# Patient Record
Sex: Female | Born: 1940
Health system: Southern US, Community
[De-identification: ages and names within clinical notes are randomized; demographics above are authoritative.]

## PROBLEM LIST (undated history)

## (undated) DIAGNOSIS — S3210XA Unspecified fracture of sacrum, initial encounter for closed fracture: Secondary | ICD-10-CM

## (undated) DIAGNOSIS — G8929 Other chronic pain: Secondary | ICD-10-CM

## (undated) DIAGNOSIS — F32A Depression, unspecified: Secondary | ICD-10-CM

## (undated) DIAGNOSIS — M1990 Unspecified osteoarthritis, unspecified site: Secondary | ICD-10-CM

## (undated) DIAGNOSIS — Q791 Other congenital malformations of diaphragm: Secondary | ICD-10-CM

## (undated) DIAGNOSIS — K219 Gastro-esophageal reflux disease without esophagitis: Secondary | ICD-10-CM

## (undated) DIAGNOSIS — F419 Anxiety disorder, unspecified: Secondary | ICD-10-CM

## (undated) DIAGNOSIS — J189 Pneumonia, unspecified organism: Secondary | ICD-10-CM

## (undated) DIAGNOSIS — F329 Major depressive disorder, single episode, unspecified: Secondary | ICD-10-CM

## (undated) DIAGNOSIS — Z8719 Personal history of other diseases of the digestive system: Secondary | ICD-10-CM

## (undated) DIAGNOSIS — K5790 Diverticulosis of intestine, part unspecified, without perforation or abscess without bleeding: Secondary | ICD-10-CM

## (undated) DIAGNOSIS — R319 Hematuria, unspecified: Secondary | ICD-10-CM

## (undated) DIAGNOSIS — J449 Chronic obstructive pulmonary disease, unspecified: Secondary | ICD-10-CM

## (undated) DIAGNOSIS — E785 Hyperlipidemia, unspecified: Secondary | ICD-10-CM

## (undated) DIAGNOSIS — M549 Dorsalgia, unspecified: Secondary | ICD-10-CM

## (undated) HISTORY — DX: Dorsalgia, unspecified: M54.9

## (undated) HISTORY — DX: Hyperlipidemia, unspecified: E78.5

## (undated) HISTORY — DX: Unspecified fracture of sacrum, initial encounter for closed fracture: S32.10XA

## (undated) HISTORY — PX: BREAST REDUCTION SURGERY: SHX8

## (undated) HISTORY — PX: TONSILLECTOMY: SUR1361

## (undated) HISTORY — PX: ROTATOR CUFF REPAIR: SHX139

## (undated) HISTORY — PX: DILATION AND CURETTAGE OF UTERUS: SHX78

## (undated) HISTORY — PX: TOTAL ABDOMINAL HYSTERECTOMY: SHX209

## (undated) HISTORY — DX: Other chronic pain: G89.29

## (undated) HISTORY — PX: VASCULAR SURGERY: SHX849

## (undated) HISTORY — DX: Hematuria, unspecified: R31.9

## (undated) HISTORY — DX: Unspecified osteoarthritis, unspecified site: M19.90

## (undated) HISTORY — DX: Gastro-esophageal reflux disease without esophagitis: K21.9

## (undated) HISTORY — DX: Diverticulosis of intestine, part unspecified, without perforation or abscess without bleeding: K57.90

## (undated) HISTORY — DX: Other congenital malformations of diaphragm: Q79.1

---

## 1998-01-30 ENCOUNTER — Ambulatory Visit (HOSPITAL_COMMUNITY): Admission: RE | Admit: 1998-01-30 | Discharge: 1998-01-30 | Payer: Self-pay | Admitting: Internal Medicine

## 1998-01-30 ENCOUNTER — Encounter: Payer: Self-pay | Admitting: Internal Medicine

## 1998-03-11 ENCOUNTER — Other Ambulatory Visit: Admission: RE | Admit: 1998-03-11 | Discharge: 1998-03-11 | Payer: Self-pay | Admitting: Obstetrics & Gynecology

## 1998-06-27 ENCOUNTER — Other Ambulatory Visit: Admission: RE | Admit: 1998-06-27 | Discharge: 1998-06-27 | Payer: Self-pay | Admitting: Oral Surgery

## 1998-10-26 ENCOUNTER — Ambulatory Visit (HOSPITAL_COMMUNITY): Admission: RE | Admit: 1998-10-26 | Discharge: 1998-10-26 | Payer: Self-pay | Admitting: Gastroenterology

## 1998-10-26 ENCOUNTER — Encounter: Payer: Self-pay | Admitting: Gastroenterology

## 1998-11-12 ENCOUNTER — Ambulatory Visit (HOSPITAL_COMMUNITY): Admission: RE | Admit: 1998-11-12 | Discharge: 1998-11-12 | Payer: Self-pay | Admitting: Obstetrics and Gynecology

## 1998-11-12 ENCOUNTER — Encounter: Payer: Self-pay | Admitting: Obstetrics and Gynecology

## 1999-02-11 ENCOUNTER — Encounter: Payer: Self-pay | Admitting: Obstetrics and Gynecology

## 1999-02-11 ENCOUNTER — Ambulatory Visit (HOSPITAL_COMMUNITY): Admission: RE | Admit: 1999-02-11 | Discharge: 1999-02-11 | Payer: Self-pay | Admitting: Obstetrics and Gynecology

## 1999-04-02 ENCOUNTER — Encounter: Payer: Self-pay | Admitting: Obstetrics and Gynecology

## 1999-04-07 ENCOUNTER — Encounter (INDEPENDENT_AMBULATORY_CARE_PROVIDER_SITE_OTHER): Payer: Self-pay

## 1999-04-07 ENCOUNTER — Inpatient Hospital Stay (HOSPITAL_COMMUNITY): Admission: RE | Admit: 1999-04-07 | Discharge: 1999-04-08 | Payer: Self-pay | Admitting: Obstetrics and Gynecology

## 2000-02-10 ENCOUNTER — Ambulatory Visit (HOSPITAL_COMMUNITY): Admission: RE | Admit: 2000-02-10 | Discharge: 2000-02-10 | Payer: Self-pay | Admitting: Obstetrics and Gynecology

## 2000-02-10 ENCOUNTER — Encounter: Payer: Self-pay | Admitting: Obstetrics and Gynecology

## 2001-02-10 ENCOUNTER — Ambulatory Visit (HOSPITAL_COMMUNITY): Admission: RE | Admit: 2001-02-10 | Discharge: 2001-02-10 | Payer: Self-pay | Admitting: Obstetrics and Gynecology

## 2001-02-10 ENCOUNTER — Encounter: Payer: Self-pay | Admitting: Obstetrics and Gynecology

## 2001-07-12 ENCOUNTER — Other Ambulatory Visit: Admission: RE | Admit: 2001-07-12 | Discharge: 2001-07-12 | Payer: Self-pay | Admitting: Obstetrics and Gynecology

## 2002-02-21 ENCOUNTER — Ambulatory Visit (HOSPITAL_COMMUNITY): Admission: RE | Admit: 2002-02-21 | Discharge: 2002-02-21 | Payer: Self-pay | Admitting: Internal Medicine

## 2002-02-21 ENCOUNTER — Encounter: Payer: Self-pay | Admitting: Internal Medicine

## 2002-07-17 ENCOUNTER — Other Ambulatory Visit: Admission: RE | Admit: 2002-07-17 | Discharge: 2002-07-17 | Payer: Self-pay | Admitting: Obstetrics and Gynecology

## 2003-02-27 ENCOUNTER — Ambulatory Visit (HOSPITAL_COMMUNITY): Admission: RE | Admit: 2003-02-27 | Discharge: 2003-02-27 | Payer: Self-pay | Admitting: Internal Medicine

## 2003-05-22 ENCOUNTER — Encounter: Admission: RE | Admit: 2003-05-22 | Discharge: 2003-05-22 | Payer: Self-pay | Admitting: Internal Medicine

## 2004-03-07 ENCOUNTER — Ambulatory Visit (HOSPITAL_COMMUNITY): Admission: RE | Admit: 2004-03-07 | Discharge: 2004-03-07 | Payer: Self-pay | Admitting: Obstetrics and Gynecology

## 2005-03-12 ENCOUNTER — Ambulatory Visit (HOSPITAL_COMMUNITY): Admission: RE | Admit: 2005-03-12 | Discharge: 2005-03-12 | Payer: Self-pay | Admitting: Obstetrics and Gynecology

## 2005-05-04 ENCOUNTER — Encounter: Admission: RE | Admit: 2005-05-04 | Discharge: 2005-05-04 | Payer: Self-pay | Admitting: Internal Medicine

## 2005-10-29 ENCOUNTER — Ambulatory Visit (HOSPITAL_COMMUNITY): Admission: RE | Admit: 2005-10-29 | Discharge: 2005-10-29 | Payer: Self-pay | Admitting: Orthopaedic Surgery

## 2006-02-20 ENCOUNTER — Emergency Department (HOSPITAL_COMMUNITY): Admission: EM | Admit: 2006-02-20 | Discharge: 2006-02-20 | Payer: Self-pay | Admitting: Emergency Medicine

## 2006-03-16 ENCOUNTER — Ambulatory Visit (HOSPITAL_COMMUNITY): Admission: RE | Admit: 2006-03-16 | Discharge: 2006-03-16 | Payer: Self-pay | Admitting: Internal Medicine

## 2006-04-02 ENCOUNTER — Ambulatory Visit (HOSPITAL_COMMUNITY): Admission: RE | Admit: 2006-04-02 | Discharge: 2006-04-02 | Payer: Self-pay | Admitting: *Deleted

## 2006-04-02 ENCOUNTER — Encounter (INDEPENDENT_AMBULATORY_CARE_PROVIDER_SITE_OTHER): Payer: Self-pay | Admitting: Specialist

## 2006-06-17 ENCOUNTER — Encounter: Admission: RE | Admit: 2006-06-17 | Discharge: 2006-06-17 | Payer: Self-pay | Admitting: Plastic Surgery

## 2006-08-27 ENCOUNTER — Encounter: Admission: RE | Admit: 2006-08-27 | Discharge: 2006-08-27 | Payer: Self-pay | Admitting: Internal Medicine

## 2007-03-18 ENCOUNTER — Ambulatory Visit (HOSPITAL_COMMUNITY): Admission: RE | Admit: 2007-03-18 | Discharge: 2007-03-18 | Payer: Self-pay | Admitting: Obstetrics and Gynecology

## 2007-10-01 ENCOUNTER — Ambulatory Visit (HOSPITAL_COMMUNITY): Admission: RE | Admit: 2007-10-01 | Discharge: 2007-10-01 | Payer: Self-pay | Admitting: Orthopaedic Surgery

## 2008-03-02 HISTORY — PX: JOINT REPLACEMENT: SHX530

## 2008-03-06 ENCOUNTER — Inpatient Hospital Stay (HOSPITAL_COMMUNITY): Admission: RE | Admit: 2008-03-06 | Discharge: 2008-03-10 | Payer: Self-pay | Admitting: Orthopaedic Surgery

## 2008-04-24 ENCOUNTER — Ambulatory Visit (HOSPITAL_COMMUNITY): Admission: RE | Admit: 2008-04-24 | Discharge: 2008-04-24 | Payer: Self-pay | Admitting: Internal Medicine

## 2009-02-19 ENCOUNTER — Encounter: Admission: RE | Admit: 2009-02-19 | Discharge: 2009-02-19 | Payer: Self-pay | Admitting: Internal Medicine

## 2009-04-30 ENCOUNTER — Ambulatory Visit (HOSPITAL_COMMUNITY): Admission: RE | Admit: 2009-04-30 | Discharge: 2009-04-30 | Payer: Self-pay | Admitting: Internal Medicine

## 2009-07-02 ENCOUNTER — Ambulatory Visit: Payer: Self-pay | Admitting: Thoracic Surgery

## 2009-07-10 ENCOUNTER — Ambulatory Visit (HOSPITAL_COMMUNITY): Admission: RE | Admit: 2009-07-10 | Discharge: 2009-07-10 | Payer: Self-pay | Admitting: Thoracic Surgery

## 2009-07-12 ENCOUNTER — Ambulatory Visit: Payer: Self-pay | Admitting: Thoracic Surgery

## 2010-01-29 ENCOUNTER — Ambulatory Visit: Payer: Self-pay | Admitting: Thoracic Surgery

## 2010-01-29 ENCOUNTER — Encounter: Admission: RE | Admit: 2010-01-29 | Discharge: 2010-01-29 | Payer: Self-pay | Admitting: Thoracic Surgery

## 2010-03-22 ENCOUNTER — Encounter: Payer: Self-pay | Admitting: Obstetrics and Gynecology

## 2010-03-22 ENCOUNTER — Encounter: Payer: Self-pay | Admitting: Thoracic Surgery

## 2010-03-22 ENCOUNTER — Encounter: Payer: Self-pay | Admitting: Internal Medicine

## 2010-03-23 ENCOUNTER — Encounter: Payer: Self-pay | Admitting: Internal Medicine

## 2010-04-18 ENCOUNTER — Other Ambulatory Visit (HOSPITAL_COMMUNITY): Payer: Self-pay | Admitting: Obstetrics and Gynecology

## 2010-04-18 DIAGNOSIS — Z1231 Encounter for screening mammogram for malignant neoplasm of breast: Secondary | ICD-10-CM

## 2010-05-02 ENCOUNTER — Ambulatory Visit (HOSPITAL_COMMUNITY)
Admission: RE | Admit: 2010-05-02 | Discharge: 2010-05-02 | Disposition: A | Discharge status: Home / Self Care | Payer: Medicare Other | Source: Ambulatory Visit | Attending: Obstetrics and Gynecology | Admitting: Obstetrics and Gynecology

## 2010-05-02 DIAGNOSIS — Z1231 Encounter for screening mammogram for malignant neoplasm of breast: Secondary | ICD-10-CM | POA: Insufficient documentation

## 2010-06-16 LAB — BASIC METABOLIC PANEL
BUN: 4 mg/dL — ABNORMAL LOW (ref 6–23)
BUN: 5 mg/dL — ABNORMAL LOW (ref 6–23)
CO2: 30 mEq/L (ref 19–32)
CO2: 30 mEq/L (ref 19–32)
CO2: 31 mEq/L (ref 19–32)
Calcium: 9.1 mg/dL (ref 8.4–10.5)
Chloride: 100 mEq/L (ref 96–112)
Chloride: 102 mEq/L (ref 96–112)
Chloride: 104 mEq/L (ref 96–112)
Chloride: 98 mEq/L (ref 96–112)
Creatinine, Ser: 0.67 mg/dL (ref 0.4–1.2)
Creatinine, Ser: 0.69 mg/dL (ref 0.4–1.2)
GFR calc Af Amer: >60 mL/min (ref >60)
GFR calc Af Amer: >60 mL/min (ref >60)
GFR calc non Af Amer: >60 mL/min (ref >60)
GFR calc non Af Amer: >60 mL/min (ref >60)
Glucose, Bld: 104 mg/dL — ABNORMAL HIGH (ref 70–99)
Glucose, Bld: 112 mg/dL — ABNORMAL HIGH (ref 70–99)
Glucose, Bld: 91 mg/dL (ref 70–99)
Potassium: 3.4 mEq/L — ABNORMAL LOW (ref 3.5–5.1)
Potassium: 3.6 mEq/L (ref 3.5–5.1)
Potassium: 3.6 mEq/L (ref 3.5–5.1)
Sodium: 138 mEq/L (ref 135–145)
Sodium: 140 mEq/L (ref 135–145)

## 2010-06-16 LAB — CBC
HCT: 27.8 % — ABNORMAL LOW (ref 36.0–46.0)
HCT: 33.5 % — ABNORMAL LOW (ref 36.0–46.0)
HCT: 39.1 % (ref 36.0–46.0)
Hemoglobin: 11.2 g/dL — ABNORMAL LOW (ref 12.0–15.0)
Hemoglobin: 13.5 g/dL (ref 12.0–15.0)
Hemoglobin: 9.7 g/dL — ABNORMAL LOW (ref 12.0–15.0)
MCHC: 33.9 g/dL (ref 30.0–36.0)
MCHC: 34 g/dL (ref 30.0–36.0)
MCHC: 34.6 g/dL (ref 30.0–36.0)
MCV: 90 fL (ref 78.0–100.0)
MCV: 91.6 fL (ref 78.0–100.0)
MCV: 92.4 fL (ref 78.0–100.0)
MCV: 92.6 fL (ref 78.0–100.0)
Platelets: 166 10*3/uL (ref 150–400)
Platelets: 185 10*3/uL (ref 150–400)
Platelets: 232 10*3/uL (ref 150–400)
RBC: 3.13 MIL/uL — ABNORMAL LOW (ref 3.87–5.11)
RBC: 4.34 MIL/uL (ref 3.87–5.11)
RDW: 13.1 % (ref 11.5–15.5)
RDW: 13.1 % (ref 11.5–15.5)
RDW: 13.8 % (ref 11.5–15.5)
WBC: 8.2 10*3/uL (ref 4.0–10.5)

## 2010-06-16 LAB — COMPREHENSIVE METABOLIC PANEL
AST: 120 U/L — ABNORMAL HIGH (ref 0–37)
Albumin: 2.2 g/dL — ABNORMAL LOW (ref 3.5–5.2)
CO2: 32 mEq/L (ref 19–32)
Calcium: 8 mg/dL — ABNORMAL LOW (ref 8.4–10.5)
Creatinine, Ser: 0.57 mg/dL (ref 0.4–1.2)
GFR calc Af Amer: >60 mL/min (ref >60)
GFR calc non Af Amer: >60 mL/min (ref >60)

## 2010-06-16 LAB — PROTIME-INR
INR: 2.9 — ABNORMAL HIGH (ref 0.00–1.49)
Prothrombin Time: 14.2 seconds (ref 11.6–15.2)
Prothrombin Time: 26.1 seconds — ABNORMAL HIGH (ref 11.6–15.2)

## 2010-07-15 NOTE — Letter (Signed)
Jul 12, 2009   Massie Maroon, MD  [address]  [place], [place] [address]   Re:  Diane Howell, Diane Howell                 DOB:  December 14, 1940   Dear Dr. Selena Batten:   I saw the patient back today for follow up of her right diaphragmatic  eventration.  Her pulmonary function tests showed an FVC of 2.47 with an  FEV-1 of 1.77 or 78% of predicted and diffusion capacity of 52%.  Other  than diffusion capacity been down, pulmonary function tests are  satisfactory.  As you know, she is very asymptomatic.  The CT scan we  did does show confirmed eventration of right diaphragm with some mild  atelectasis of the right lower lobe and the diaphragm is elevated up to  just below the carina.  I think eventually she may have to have a  diaphragmatic plication, particularly if it becomes more elevated.  Right now, I do not think given that she is relatively asymptomatic a  surgical resection would be helpful.  I will see her again in 6 months  with a chest x-ray.  She knows to call me before if she develops more  symptoms of dyspnea.  Her blood pressure was 112/61, pulse 84,  respirations 18, sats were 97%.   Ines Bloomer, M.D.  Electronically Signed   DPB/MEDQ  D:  07/12/2009  T:  07/13/2009  Job:  [id number]

## 2010-07-15 NOTE — Letter (Signed)
January 29, 2010   Massie Maroon, MD  [address]  [place] [address]   Re:  Diane Howell, Diane Howell                 DOB:  [birth year]   Dear Dr. Selena Batten:   I saw the patient back in the office today.  Her blood pressure is  107/69, pulse 74, respirations 18, and sats were 96%.  Chest x-ray still  shows elevation of the right diaphragm which she has eventration.  She  has no more shortness of breath.  She has occasional right lower  quadrant pain.  I told her if this continues, then she may want to see  again an gynecologist about this.  Otherwise, she does not want to have  anything done regarding her diaphragm and since there is no major  change, we will see her back again in 6 months with another chest x-ray.  Her blood pressure is 107/69, pulse 74, respirations 18, and sats were  96%.  Lungs were clear to auscultation and percussion.   DICTATION ENDS HERE.   Ines Bloomer, M.D.  Electronically Signed   DPB/MEDQ  D:  01/29/2010  T:  01/30/2010  Job:  [id number]

## 2010-07-15 NOTE — Discharge Summary (Signed)
NAMEADYN, Diane Howell                 ACCOUNT NO.:  [id number]   MEDICAL RECORD NO.:  [id number]          PATIENT TYPE:  INP   LOCATION:  5007                         FACILITY:  [facility]   PHYSICIAN:  Lubertha Basque. Dalldorf, M.D.DATE OF BIRTH:  02-16-1941   DATE OF ADMISSION:  03/06/2008  DATE OF DISCHARGE:  03/10/2008                               DISCHARGE SUMMARY   PREOPERATIVE DIAGNOSES:  1. Right hip end-stage degenerative joint disease.  2. History of gastroesophageal reflux disease.   DISCHARGE DIAGNOSES:  1. Right hip end-stage degenerative joint disease.  2. History of gastroesophageal reflux disease.   OPERATIONS:  Right total hip replacement.   BRIEF HISTORY:  Diane Howell is a 70 year old white female patient well  known to our practice, who has had increasing pain in her right hip.  Pain with ambulation, trouble sleeping at night time.  X-rays reveal end-  stage DJD and we have discussed with her total hip replacement surgery.   PERTINENT LABORATORY DATA AND X-RAY FINDINGS:  EKG normal sinus rhythm.  WBCs 8.4; hemoglobin, last testing 8.9; platelets 185.  Serial INR's  were done as she was on low-dose Coumadin protocol 2.9, last testing.  Potassium 36, sodium 138, glucose 91, BUN 9, creatinine 0.69.   COURSE IN THE HOSPITAL:  She was admitted postoperatively, placed on  variety of p.o. and IM analgesics for pain, IV Ancef 1 g q.8 h. x3 doses  was used.  Coumadin and Lovenox protocol per pharmacy, PCA pump for pain  as well, incentive spirometry, knee-high TED's, Foley catheter also  used, and then mobilization to be partial weightbearing on her leg with  physical therapy.  The first day, postop, her vital signs were stable,  blood pressure 91/54.  INR 1.1, hemoglobin within normal limits.  Positive bowel sounds.  Abdomen soft.  Lungs were clear.  Foley catheter  was in, which was later discontinued.  Dressing was dry.  Wound was  noted to be benign.  Second day, postop, vital  signs remained stable.  Her dressing was changed on her hip.  She had no sign of infection or  irritation.  Calf soft and nontender.  Foley catheter was removed.  She  progressed well with therapy once they got her out of bed.  It did take  a couple of days for her to be able to do the touchdown and partial  weightbearing gait, which I think prolonged her hospital stay slightly,  but she was discharged home.  She will be on Coumadin for 4 weeks, this  will be discussed with her.   CONDITION ON DISCHARGE:  Improved on a low-sodium heart-healthy diet.  However, may change her dressing daily.  Return to see Dr. Jerl Santos in  10 days or sooner if there is any sign of infection, which has been  explained to her, office number [phone].  Touchdown weightbearing with  crutches or walker, [facility].  INR show B-  MET to be drawn because of slightly low potassium in 2-3 days.  She was  kept on Coumadin, first dose of regulated  by Pharmacy, and then followed  up by [facility] pharmacist.  Potassium 20 mEq one a day, and then kept on her home medicines, which  were Premarin 0.31 a day, Lexapro 10 mg one a day, Darvocet for pain as  needed, Ambien for sleep, Zocor 40 mg one a day Xyzal 5 mg one a day,  and Valium 5 mg as needed.      Lindwood Qua, P.A.      Lubertha Basque Jerl Santos, M.D.  Electronically Signed    MC/MEDQ  D:  04/12/2008  T:  04/13/2008  Job:  [id number]

## 2010-07-15 NOTE — Letter (Signed)
Jul 02, 2009   Massie Maroon, MD  [address]  [place], [place] [address]   Re:  Diane Howell, Diane Howell                 DOB:  1940/04/24   Dear Dr. Selena Batten:   I saw the patient in the office today.  This patient has had multiple  problems.  She recently had some mid upper abdominal pain radiating  along her costal cartilages and she felt that there is pain.  She has  had a past history of having diverticulosis, GERD, and unable to eat as  well as a history of fibromyalgia.  She has a history of being diagnosed  with a hiatal hernia in 2010.   PAST MEDICAL HISTORY:  Significant that she had a left hip arthroplasty  in 2010.  She had a breast reduction in 2008, abdominal surgery in 2009,  she had a rotator cuff surgery in 2003.  In 2001, she had a total  abdominal hysterectomy and in 1990, she had cataract surgery, tubal  ligation, and D and C.  She also has L1 osteoporosis.  She is referred  here because of abdominal CT scan, which shows eventration of her  diaphragm.  X-ray in the [facility] from 2009 also shows elevation of  the diaphragm at that time.  She tells she does not have any shortness  of breath with exertion.  She has had no hemoptysis, fever, chills, or  excessive sputum.   The CT scan that was sent showed that the diaphragm is elevated on the  right side with some impression of the right lower lobe.  There also is  evidence of the right colon being also up into the right chest.  She  said that she has been having lot of anxiety, been unable to eat.   MEDICATIONS:  1. Premarin 0.3 a day.  2. Citalopram 20 mg daily.  3. Dexilant 60 mg daily.  4. Hydrocodone p.r.n.  5. Fiorinal p.r.n.  6. Ambien 10 mg daily.  7. Diazepam 5 mg daily.  8. Simvastatin 40 mg daily.  9. Osteo Bi-Flex 2 a day.  10.MultiVites.  11.Allegra.   ALLERGIES:  She has no allergies.   Besides anxiety, she has migraines and some chronic back pain.  Previous  lumbar of the spine showed a chronic L1  deformity.   FAMILY HISTORY:  Positive for coronary artery disease, diabetes,  osteoporosis, hypertension, and breast cancer.   SOCIAL HISTORY:  She does smoke.  She is retired.  She was a Insurance underwriter.  She is a widow, has no children.  She quit  smoking in 1988.  Does not drink alcohol on a regular basis.   REVIEW OF SYSTEMS:  VITAL SIGNS:  She is 150 pounds.  She is 64 inches.  GENERAL:  She had loss of appetite.  CARDIAC:  She has shortness of breath and palpitations.  PULMONARY:  Bronchitis.  GI:  See history of present illness.  GU:  No kidney disease, dysuria, or frequent urination.  VASCULAR:  No claudication, DVT, or TIAs.  NEUROLOGICAL:  See past medical history.  MUSCULOSKELETAL:  Arthritis and joint pain.  PSYCHIATRIC:  See past medical history.  She had depression and  nervousness.  EYE/ENT:  No change in eyesight or hearing.  HEMATOLOGIC:  No problems with bleeding, clotting disorders, or anemia.   PHYSICAL EXAMINATION:  Vital Signs:  Her blood pressure is 126/76, pulse  77, respirations 18, and sats were 96%.  Head, Eyes, Ears, Nose, and  Throat:  Unremarkable.  Neck:  Supple without thyromegaly.  Chest:  Clear to auscultation and percussion.  Heart:  Regular, sinus rhythm.  No murmurs.  There is slight decreased breath sounds in the right base.  Abdomen:  Soft.  There is no hepatosplenomegaly.  Extremities:  Pulses  are 2+.  There is no clubbing or edema.  Neurologic:  She is oriented  x3.  Sensory and motor intact.  Cranial nerves intact.   She unfortunately does have a fairly large eventration of the diaphragm,  but is asymptomatic.  I am planning to get pulmonary function tests on  her as well as a CT scan of the chest to look at the whole situation  completely including much atelectasis in the areas of the right lower  lobe, but generally, we do not repair these unless patients are very  symptomatic and I am not sure she falls in that  category, but I will let  you what our findings are.   Sincerely,   Ines Bloomer, M.D.  Electronically Signed   DPB/MEDQ  D:  07/02/2009  T:  07/03/2009  Job:  [id number]

## 2010-07-15 NOTE — Op Note (Signed)
Diane Howell                 ACCOUNT NO.:  [id number]   MEDICAL RECORD NO.:  [id number]          PATIENT TYPE:  INP   LOCATION:  5007                         FACILITY:  [facility]   PHYSICIAN:  Lubertha Basque. Dalldorf, M.D.DATE OF BIRTH:  07/18/1940   DATE OF PROCEDURE:  03/06/2008  DATE OF DISCHARGE:                               OPERATIVE REPORT   PREOPERATIVE DIAGNOSIS:  Right hip avascular necrosis.   POSTOPERATIVE DIAGNOSIS:  Right hip avascular necrosis.   PROCEDURE:  Right total hip replacement.   ANESTHESIA:  General.   ATTENDING SURGEON:  Lubertha Basque. Jerl Santos, MD   ASSISTANT:  Lindwood Qua, PA   INDICATIONS FOR PROCEDURE:  The patient is a 70 year old woman with a  long history of a painful right hip.  By x-ray, things look relatively  normal, but by MRI she has extensive avascular necrosis.  She has failed  various pills along with intra-articular injections, which have helped  transiently.  She has pain, which limits her ability to walk and rest  and she is offered a hip replacement at this point.  Informed operative  consent was obtained after discussion of possible complications of  reaction to anesthesia, infection, DVT, PE, dislocation, and death.   SUMMARY, FINDINGS, AND PROCEDURE:  Under general anesthesia through a  standard posterior approach, a right total hip replacement was  performed.  She had softening of the femoral head and fluid under  pressure in the hip joint consistent with avascular necrosis.  She had  good bone quality.  We addressed her problem with a porous-ingrowth  system by DePuy using a Summit DuoFix, 4 high offset femoral stem with a  +5, 36 hip ball, which fit into a 50 pinnacle cup with a 36 x 50 metal  liner.  We did use a hole eliminator.  [name] assisted throughout  and was invaluable to the completion of the case in that he helped  position and retract while I performed the procedure.  He also closed  simultaneously to help  minimize OR time.   DESCRIPTION OF PROCEDURE:  The patient was taken to the operating suite  where general anesthetic was applied without difficulty.  She was  positioned in the lateral decubitus position with the right hip up.  Hip  positioners were utilized, axillary roll was placed, and all bony  prominences were appropriately padded.  She was prepped and draped in  normal sterile fashion.  After the administration of IV Kefzol, a  posterior approach was taken to the right hip.  An incision was made  with dissection down through adipose tissue to the IT band and gluteus  maximus fascia, which were incised longitudinally.  The short external  rotators of the hip were tagged and reflected followed by posterior  capsulectomy and dislocation of the hip.  Findings were as noted above.  A femoral neck cut was made just above the lesser trochanter.  The  acetabulum was fully exposed and some labral tissues were excised.  She  did appear to have had developed a significant blood supply through the  ligamentum in  the split profusely, as we cut this during exposure.  A  reaming was taken centrally slightly and then enlarged up to 49 followed  by placement of a size 50 pinnacle cup in appropriate anteversion and  tilt.  We then placed a trial liner.  Attention was turned toward the  femur.  This was reamed and broached up to a 4.  A trial reduction was  done with a 4 high offset component and various neck lengths with a +5  seemed to give [place] the best leg length equality and stability.  Trial  reduction was done and she was stable in extension with external  rotation and flexion with internal rotation.  The trial components were  removed followed by placement of a hole eliminator at the acetabulum and  placement of a 36 x 50 metal liner.  The femur was then addressed with a  4 high offset Summit stem and this was placed in slight anteversion.  We  then placed a +5, 36 hip ball on a dry neck.  Hip  was again reduced and  again was stable.  Leg lengths were felt to be roughly equal.  The wound  was irrigated several times followed by reapproximation of the short  external rotators to the greater trochanter.  Greater trochanteric  region with nonabsorbable suture.  IT band and gluteus maximus fascia  reapproximated with #1 Vicryl interrupted fashion followed by  subcutaneous reapproximation with 0 and 2-0 undyed Vicryl and skin  closure with staples.  Adaptic was applied followed by dry gauze and  tape.  Estimated blood loss and intraoperative fluids obtained from  anesthesia records.   DISPOSITION:  The patient was extubated in the operating room and taken  to recovery room in stable addition.  She will be admitted to the  [facility] for appropriate postop care to include  perioperative antibiotics and Coumadin plus Lovenox for DVT prophylaxis.      Lubertha Basque Jerl Santos, M.D.  Electronically Signed     PGD/MEDQ  D:  03/06/2008  T:  03/06/2008  Job:  [id number]

## 2010-07-18 NOTE — Op Note (Signed)
NAME:  Diane Howell, EIDE NO.:  [id number]   MEDICAL RECORD NO.:  [id number]          PATIENT TYPE:  AMB   LOCATION:  ENDO                         FACILITY:  [facility]   PHYSICIAN:  Georgiana Spinner, M.D.    DATE OF BIRTH:  1940-08-20   DATE OF PROCEDURE:  04/02/2006  DATE OF DISCHARGE:                               OPERATIVE REPORT   SURGEON:  Georgiana Spinner, M.D.   PROCEDURE:  Upper endoscopy.   INDICATIONS:  Hemoccult positivity.   ANESTHESIA:  Fentanyl 100 mcg, Versed 10 mg, Phenergan 12.5 mg.   PROCEDURE:  With the patient mildly sedated in the left lateral  decubitus position, the Pentax videoscopic endoscope was inserted in the  mouth and passed under direct vision through the esophagus, which  appeared normal.  There was no evidence of Barrett esophagus or  esophagitis.  Entered into the stomach, and we saw streaks of blood,  dark in color, in the fundus.  We advanced to the antrum, and linear  erythema was seen, emanating from the pylorus, and this was photographed  and multiple biopsies of this area were taken to rule out GAVE.  Once  accomplished, we advanced the endoscope into the duodenal bulb and  second portion of the duodenum, both of which appeared normal.  From  this point, the endoscope was the slowly withdrawn, taking  circumferential views of the duodenal mucosa, until the endoscope was  then pulled back into the stomach and placed in retroflexion to view the  stomach from below.  The endoscope was then straightened and withdrawn,  taking circumferential views of the remaining gastric and esophageal  mucosa.  The patient's vital signs and pulse oximeter remained stable.  The patient tolerated the procedure well without apparent complication.   FINDINGS:  Blood in stomach and linear erythema in the gastric antrum,  await biopsy report.  The patient will call me for results and follow up  with me as an outpatient, and I presume that this may be the  cause of  the patient's Hemoccult positivity.  We will proceed to colonoscopy as  planned.           ______________________________  Georgiana Spinner, M.D.     GMO/MEDQ  D:  04/02/2006  T:  04/02/2006  Job:  [id number]

## 2010-07-18 NOTE — Op Note (Signed)
NAME:  Diane Howell, Diane Howell NO.:  [id number]   MEDICAL RECORD NO.:  [id number]          PATIENT TYPE:  AMB   LOCATION:  ENDO                         FACILITY:  [facility]   PHYSICIAN:  Georgiana Spinner, M.D.    DATE OF BIRTH:  [birth year]   DATE OF PROCEDURE:  04/02/2006  DATE OF DISCHARGE:                               OPERATIVE REPORT   SURGEON:  Georgiana Spinner, M.D.   PROCEDURE:  Colonoscopy.   INDICATIONS:  Hemoccult positivity.   ANESTHESIA:  Fentanyl 25 mcg and Phenergan 12.5 mg.   DESCRIPTION OF PROCEDURE:  With the patient mildly sedated in the left  lateral decubitus position, the Pentax videoscopic colonoscope was  inserted into the rectum and passed under direct vision to the cecum,  identified by the ileocecal valve and appendiceal orifice, both of which  were photographed.  From this point, the colonoscope was slowly  withdrawn, taking circumferential views of colonic mucosa, stopping only  in the rectum, which appeared normal.  And the rectum showed hemorrhoids  on retroflex view.  The endoscope was then straightened and withdrawn.  The patient's vital signs and pulse oximeter remained stable.  The  patient tolerated the procedure well and without apparent complications.   FINDINGS:  Internal hemorrhoids and some diverticulosis noted in the  sigmoid colon, but otherwise an unremarkable colonoscopic examination.   PLAN:  See endoscopy note for further details of followup.           ______________________________  Georgiana Spinner, M.D.     GMO/MEDQ  D:  04/02/2006  T:  04/02/2006  Job:  [id number]

## 2010-07-29 ENCOUNTER — Other Ambulatory Visit: Payer: Self-pay | Admitting: Thoracic Surgery

## 2010-07-29 DIAGNOSIS — I2510 Atherosclerotic heart disease of native coronary artery without angina pectoris: Secondary | ICD-10-CM

## 2010-07-30 ENCOUNTER — Ambulatory Visit
Admission: RE | Admit: 2010-07-30 | Discharge: 2010-07-30 | Disposition: A | Discharge status: Home / Self Care | Payer: Medicare Other | Source: Ambulatory Visit | Attending: Thoracic Surgery | Admitting: Thoracic Surgery

## 2010-07-30 ENCOUNTER — Ambulatory Visit (INDEPENDENT_AMBULATORY_CARE_PROVIDER_SITE_OTHER): Payer: Medicare Other | Admitting: Thoracic Surgery

## 2010-07-30 DIAGNOSIS — K449 Diaphragmatic hernia without obstruction or gangrene: Secondary | ICD-10-CM

## 2010-07-30 DIAGNOSIS — I2510 Atherosclerotic heart disease of native coronary artery without angina pectoris: Secondary | ICD-10-CM

## 2010-07-31 NOTE — Letter (Signed)
Jul 30, 2010  Massie Maroon, MD [address] [place] [address]  Re:  Diane Howell, Diane Howell                 DOB:  09-Aug-1940  Dear Dr. Selena Batten:  I saw the patient back today for your followup.  In May 2011, we saw her because of an elevated right diaphragm which a CT scan also showed that she had a 5-cm cyst in her left lower lobe of her liver.  The chest x- ray today showed that there is no change in her right diaphragm.  She had a recent sinus infection and she gets some shortness of breath when she has respiratory problems, but she reports no more shortness of breath than usual.  I did discuss with her that if this increases, we could do a diaphragmatic plication on the right which she is not really interested in that at the time and she is not symptomatic.  I would suggest that she get at least a CT scan every 2-3 years to follow up on this fairly large liver cyst and see if there is any progression of her diaphragm eventration.  I will be happy to see her again at any time. Her blood pressure is 112/64, pulse 87, respirations 20, sats were 95%.  Ines Bloomer, M.D. Electronically Signed  DPB/MEDQ  D:  07/30/2010  T:  07/31/2010  Job:  [id number]

## 2011-06-10 ENCOUNTER — Other Ambulatory Visit (HOSPITAL_COMMUNITY): Payer: Self-pay | Admitting: Internal Medicine

## 2011-06-10 DIAGNOSIS — Z1231 Encounter for screening mammogram for malignant neoplasm of breast: Secondary | ICD-10-CM

## 2011-06-16 ENCOUNTER — Ambulatory Visit (HOSPITAL_COMMUNITY)
Admission: RE | Admit: 2011-06-16 | Discharge: 2011-06-16 | Disposition: A | Discharge status: Home / Self Care | Payer: Medicare Other | Source: Ambulatory Visit | Attending: Internal Medicine | Admitting: Internal Medicine

## 2011-06-16 DIAGNOSIS — Z1231 Encounter for screening mammogram for malignant neoplasm of breast: Secondary | ICD-10-CM | POA: Insufficient documentation

## 2012-06-21 ENCOUNTER — Other Ambulatory Visit (HOSPITAL_COMMUNITY): Payer: Self-pay | Admitting: Internal Medicine

## 2012-06-21 DIAGNOSIS — Z1231 Encounter for screening mammogram for malignant neoplasm of breast: Secondary | ICD-10-CM

## 2012-06-23 ENCOUNTER — Ambulatory Visit (HOSPITAL_COMMUNITY)
Admission: RE | Admit: 2012-06-23 | Discharge: 2012-06-23 | Disposition: A | Discharge status: Home / Self Care | Payer: Medicare Other | Source: Ambulatory Visit | Attending: Internal Medicine | Admitting: Internal Medicine

## 2012-06-23 DIAGNOSIS — Z1231 Encounter for screening mammogram for malignant neoplasm of breast: Secondary | ICD-10-CM

## 2013-06-09 ENCOUNTER — Other Ambulatory Visit (HOSPITAL_COMMUNITY): Payer: Self-pay | Admitting: Internal Medicine

## 2013-06-09 DIAGNOSIS — Z1231 Encounter for screening mammogram for malignant neoplasm of breast: Secondary | ICD-10-CM

## 2013-06-29 ENCOUNTER — Ambulatory Visit (HOSPITAL_COMMUNITY)
Admission: RE | Admit: 2013-06-29 | Discharge: 2013-06-29 | Disposition: A | Discharge status: Home / Self Care | Payer: Medicare Other | Source: Ambulatory Visit | Attending: Internal Medicine | Admitting: Internal Medicine

## 2013-06-29 DIAGNOSIS — Z1231 Encounter for screening mammogram for malignant neoplasm of breast: Secondary | ICD-10-CM | POA: Insufficient documentation

## 2014-03-01 ENCOUNTER — Other Ambulatory Visit (HOSPITAL_COMMUNITY): Payer: Self-pay | Admitting: Respiratory Therapy

## 2014-03-01 DIAGNOSIS — R0600 Dyspnea, unspecified: Secondary | ICD-10-CM

## 2014-03-06 DIAGNOSIS — R05 Cough: Secondary | ICD-10-CM | POA: Diagnosis not present

## 2014-03-06 DIAGNOSIS — R0602 Shortness of breath: Secondary | ICD-10-CM | POA: Diagnosis not present

## 2014-03-08 DIAGNOSIS — R0602 Shortness of breath: Secondary | ICD-10-CM | POA: Diagnosis not present

## 2014-03-08 DIAGNOSIS — R0789 Other chest pain: Secondary | ICD-10-CM | POA: Diagnosis not present

## 2014-03-09 ENCOUNTER — Encounter (INDEPENDENT_AMBULATORY_CARE_PROVIDER_SITE_OTHER): Payer: Self-pay

## 2014-03-09 ENCOUNTER — Ambulatory Visit (HOSPITAL_COMMUNITY)
Admission: RE | Admit: 2014-03-09 | Discharge: 2014-03-09 | Disposition: A | Discharge status: Home / Self Care | Payer: Medicare Other | Source: Ambulatory Visit | Attending: Internal Medicine | Admitting: Internal Medicine

## 2014-03-09 DIAGNOSIS — R0600 Dyspnea, unspecified: Secondary | ICD-10-CM | POA: Diagnosis not present

## 2014-03-09 MED ORDER — ALBUTEROL SULFATE (2.5 MG/3ML) 0.083% IN NEBU
2.5000 mg | INHALATION_SOLUTION | Freq: Once | RESPIRATORY_TRACT | Status: AC
  Administered 2014-03-09: 2.5 mg via RESPIRATORY_TRACT

## 2014-03-12 DIAGNOSIS — R0789 Other chest pain: Secondary | ICD-10-CM | POA: Diagnosis not present

## 2014-03-12 DIAGNOSIS — R0602 Shortness of breath: Secondary | ICD-10-CM | POA: Diagnosis not present

## 2014-03-12 LAB — PULMONARY FUNCTION TEST
DL/VA % PRED: 65 %
DL/VA: 3.16 ml/min/mmHg/L
DLCO UNC % PRED: 33 %
DLCO unc: 8.1 ml/min/mmHg
FEF 25-75 POST: 1.55 L/s
FEF 25-75 Pre: 1.24 L/sec
FEF2575-%Change-Post: 24 %
FEF2575-%PRED-PRE: 76 %
FEF2575-%Pred-Post: 95 %
FEV1-%Change-Post: 5 %
FEV1-%PRED-POST: 71 %
FEV1-%Pred-Pre: 67 %
FEV1-PRE: 1.42 L
FEV1-Post: 1.49 L
FEV1FVC-%CHANGE-POST: 0 %
FEV1FVC-%Pred-Pre: 104 %
FEV6-%CHANGE-POST: 4 %
FEV6-%PRED-PRE: 68 %
FEV6-%Pred-Post: 71 %
FEV6-PRE: 1.81 L
FEV6-Post: 1.9 L
FEV6FVC-%PRED-POST: 105 %
FEV6FVC-%Pred-Pre: 105 %
FVC-%Change-Post: 4 %
FVC-%PRED-PRE: 64 %
FVC-%Pred-Post: 67 %
FVC-Post: 1.9 L
FVC-Pre: 1.81 L
PRE FEV6/FVC RATIO: 100 %
Post FEV1/FVC ratio: 78 %
Post FEV6/FVC ratio: 100 %
Pre FEV1/FVC ratio: 78 %
RV % pred: 77 %
RV: 1.77 L
TLC % PRED: 70 %
TLC: 3.57 L

## 2014-03-21 DIAGNOSIS — R0602 Shortness of breath: Secondary | ICD-10-CM | POA: Diagnosis not present

## 2014-03-21 DIAGNOSIS — R05 Cough: Secondary | ICD-10-CM | POA: Diagnosis not present

## 2014-03-29 DIAGNOSIS — F1120 Opioid dependence, uncomplicated: Secondary | ICD-10-CM | POA: Diagnosis not present

## 2014-03-29 DIAGNOSIS — Z5181 Encounter for therapeutic drug level monitoring: Secondary | ICD-10-CM | POA: Diagnosis not present

## 2014-03-29 DIAGNOSIS — R61 Generalized hyperhidrosis: Secondary | ICD-10-CM | POA: Diagnosis not present

## 2014-04-10 DIAGNOSIS — M47816 Spondylosis without myelopathy or radiculopathy, lumbar region: Secondary | ICD-10-CM | POA: Diagnosis not present

## 2014-04-26 DIAGNOSIS — E780 Pure hypercholesterolemia: Secondary | ICD-10-CM | POA: Diagnosis not present

## 2014-04-26 DIAGNOSIS — M810 Age-related osteoporosis without current pathological fracture: Secondary | ICD-10-CM | POA: Diagnosis not present

## 2014-04-26 DIAGNOSIS — G4700 Insomnia, unspecified: Secondary | ICD-10-CM | POA: Diagnosis not present

## 2014-04-26 DIAGNOSIS — F419 Anxiety disorder, unspecified: Secondary | ICD-10-CM | POA: Diagnosis not present

## 2014-05-29 DIAGNOSIS — F419 Anxiety disorder, unspecified: Secondary | ICD-10-CM | POA: Diagnosis not present

## 2014-05-29 DIAGNOSIS — M5430 Sciatica, unspecified side: Secondary | ICD-10-CM | POA: Diagnosis not present

## 2014-06-04 DIAGNOSIS — M5416 Radiculopathy, lumbar region: Secondary | ICD-10-CM | POA: Diagnosis not present

## 2014-06-04 DIAGNOSIS — M2560 Stiffness of unspecified joint, not elsewhere classified: Secondary | ICD-10-CM | POA: Diagnosis not present

## 2014-06-04 DIAGNOSIS — M6281 Muscle weakness (generalized): Secondary | ICD-10-CM | POA: Diagnosis not present

## 2014-06-07 DIAGNOSIS — M5416 Radiculopathy, lumbar region: Secondary | ICD-10-CM | POA: Diagnosis not present

## 2014-06-07 DIAGNOSIS — M6281 Muscle weakness (generalized): Secondary | ICD-10-CM | POA: Diagnosis not present

## 2014-06-07 DIAGNOSIS — M2560 Stiffness of unspecified joint, not elsewhere classified: Secondary | ICD-10-CM | POA: Diagnosis not present

## 2014-06-12 DIAGNOSIS — M5416 Radiculopathy, lumbar region: Secondary | ICD-10-CM | POA: Diagnosis not present

## 2014-06-28 ENCOUNTER — Other Ambulatory Visit (HOSPITAL_COMMUNITY): Payer: Self-pay | Admitting: Internal Medicine

## 2014-06-28 DIAGNOSIS — Z1231 Encounter for screening mammogram for malignant neoplasm of breast: Secondary | ICD-10-CM

## 2014-07-02 ENCOUNTER — Ambulatory Visit (HOSPITAL_COMMUNITY): Payer: Medicare Other

## 2014-08-16 DIAGNOSIS — S20212A Contusion of left front wall of thorax, initial encounter: Secondary | ICD-10-CM | POA: Diagnosis not present

## 2014-08-29 DIAGNOSIS — K219 Gastro-esophageal reflux disease without esophagitis: Secondary | ICD-10-CM | POA: Diagnosis not present

## 2014-08-29 DIAGNOSIS — R112 Nausea with vomiting, unspecified: Secondary | ICD-10-CM | POA: Diagnosis not present

## 2014-08-30 ENCOUNTER — Other Ambulatory Visit: Payer: Self-pay | Admitting: Internal Medicine

## 2014-08-30 DIAGNOSIS — R3129 Other microscopic hematuria: Secondary | ICD-10-CM

## 2014-09-10 DIAGNOSIS — R112 Nausea with vomiting, unspecified: Secondary | ICD-10-CM | POA: Diagnosis not present

## 2014-09-10 DIAGNOSIS — R140 Abdominal distension (gaseous): Secondary | ICD-10-CM | POA: Diagnosis not present

## 2014-09-10 DIAGNOSIS — E782 Mixed hyperlipidemia: Secondary | ICD-10-CM | POA: Diagnosis not present

## 2014-09-18 ENCOUNTER — Ambulatory Visit (HOSPITAL_COMMUNITY)
Admission: RE | Admit: 2014-09-18 | Discharge: 2014-09-18 | Disposition: A | Discharge status: Home / Self Care | Payer: Medicare Other | Source: Ambulatory Visit | Attending: Internal Medicine | Admitting: Internal Medicine

## 2014-09-18 DIAGNOSIS — Z1231 Encounter for screening mammogram for malignant neoplasm of breast: Secondary | ICD-10-CM | POA: Diagnosis not present

## 2014-09-26 DIAGNOSIS — R51 Headache: Secondary | ICD-10-CM | POA: Diagnosis not present

## 2014-09-26 DIAGNOSIS — K5900 Constipation, unspecified: Secondary | ICD-10-CM | POA: Diagnosis not present

## 2014-09-26 DIAGNOSIS — E559 Vitamin D deficiency, unspecified: Secondary | ICD-10-CM | POA: Diagnosis not present

## 2014-09-26 DIAGNOSIS — Z0000 Encounter for general adult medical examination without abnormal findings: Secondary | ICD-10-CM | POA: Diagnosis not present

## 2014-10-02 DIAGNOSIS — R112 Nausea with vomiting, unspecified: Secondary | ICD-10-CM | POA: Diagnosis not present

## 2014-10-02 DIAGNOSIS — R140 Abdominal distension (gaseous): Secondary | ICD-10-CM | POA: Diagnosis not present

## 2014-12-23 DIAGNOSIS — M25522 Pain in left elbow: Secondary | ICD-10-CM | POA: Diagnosis not present

## 2014-12-23 DIAGNOSIS — S299XXA Unspecified injury of thorax, initial encounter: Secondary | ICD-10-CM | POA: Diagnosis not present

## 2014-12-23 DIAGNOSIS — S42292A Other displaced fracture of upper end of left humerus, initial encounter for closed fracture: Secondary | ICD-10-CM | POA: Diagnosis not present

## 2014-12-23 DIAGNOSIS — J449 Chronic obstructive pulmonary disease, unspecified: Secondary | ICD-10-CM | POA: Diagnosis not present

## 2014-12-23 DIAGNOSIS — S42202A Unspecified fracture of upper end of left humerus, initial encounter for closed fracture: Secondary | ICD-10-CM | POA: Diagnosis not present

## 2014-12-23 DIAGNOSIS — S42252A Displaced fracture of greater tuberosity of left humerus, initial encounter for closed fracture: Secondary | ICD-10-CM | POA: Diagnosis not present

## 2015-01-02 DIAGNOSIS — S42215A Unspecified nondisplaced fracture of surgical neck of left humerus, initial encounter for closed fracture: Secondary | ICD-10-CM | POA: Diagnosis not present

## 2015-01-02 DIAGNOSIS — M25511 Pain in right shoulder: Secondary | ICD-10-CM | POA: Diagnosis not present

## 2015-01-30 DIAGNOSIS — S42215D Unspecified nondisplaced fracture of surgical neck of left humerus, subsequent encounter for fracture with routine healing: Secondary | ICD-10-CM | POA: Diagnosis not present

## 2015-01-30 DIAGNOSIS — M25512 Pain in left shoulder: Secondary | ICD-10-CM | POA: Diagnosis not present

## 2015-03-25 DIAGNOSIS — E559 Vitamin D deficiency, unspecified: Secondary | ICD-10-CM | POA: Diagnosis not present

## 2015-03-25 DIAGNOSIS — E7800 Pure hypercholesterolemia, unspecified: Secondary | ICD-10-CM | POA: Diagnosis not present

## 2015-03-27 DIAGNOSIS — N390 Urinary tract infection, site not specified: Secondary | ICD-10-CM | POA: Diagnosis not present

## 2015-03-28 DIAGNOSIS — S42309A Unspecified fracture of shaft of humerus, unspecified arm, initial encounter for closed fracture: Secondary | ICD-10-CM | POA: Diagnosis not present

## 2015-03-28 DIAGNOSIS — Z0001 Encounter for general adult medical examination with abnormal findings: Secondary | ICD-10-CM | POA: Diagnosis not present

## 2015-03-28 DIAGNOSIS — R05 Cough: Secondary | ICD-10-CM | POA: Diagnosis not present

## 2015-03-28 DIAGNOSIS — R197 Diarrhea, unspecified: Secondary | ICD-10-CM | POA: Diagnosis not present

## 2015-04-03 DIAGNOSIS — R197 Diarrhea, unspecified: Secondary | ICD-10-CM | POA: Diagnosis not present

## 2015-04-03 DIAGNOSIS — K219 Gastro-esophageal reflux disease without esophagitis: Secondary | ICD-10-CM | POA: Diagnosis not present

## 2015-04-03 DIAGNOSIS — E782 Mixed hyperlipidemia: Secondary | ICD-10-CM | POA: Diagnosis not present

## 2015-04-09 DIAGNOSIS — D122 Benign neoplasm of ascending colon: Secondary | ICD-10-CM | POA: Diagnosis not present

## 2015-04-09 DIAGNOSIS — K5730 Diverticulosis of large intestine without perforation or abscess without bleeding: Secondary | ICD-10-CM | POA: Diagnosis not present

## 2015-04-09 DIAGNOSIS — R197 Diarrhea, unspecified: Secondary | ICD-10-CM | POA: Diagnosis not present

## 2015-04-09 DIAGNOSIS — K635 Polyp of colon: Secondary | ICD-10-CM | POA: Diagnosis not present

## 2015-04-17 ENCOUNTER — Ambulatory Visit (INDEPENDENT_AMBULATORY_CARE_PROVIDER_SITE_OTHER): Payer: Medicare Other | Admitting: Internal Medicine

## 2015-04-17 ENCOUNTER — Encounter: Payer: Self-pay | Admitting: Internal Medicine

## 2015-04-17 ENCOUNTER — Other Ambulatory Visit (INDEPENDENT_AMBULATORY_CARE_PROVIDER_SITE_OTHER): Payer: Medicare Other

## 2015-04-17 VITALS — BP 132/84 | HR 85 | Ht 64.0 in | Wt 153.6 lb

## 2015-04-17 DIAGNOSIS — R0600 Dyspnea, unspecified: Secondary | ICD-10-CM

## 2015-04-17 DIAGNOSIS — R0689 Other abnormalities of breathing: Secondary | ICD-10-CM | POA: Diagnosis not present

## 2015-04-17 DIAGNOSIS — R05 Cough: Secondary | ICD-10-CM

## 2015-04-17 DIAGNOSIS — R053 Chronic cough: Secondary | ICD-10-CM

## 2015-04-17 LAB — CARDIAC PANEL
CK MB: 2.8 ng/mL (ref 0.3–4.0)
RELATIVE INDEX: 4.2 calc — AB (ref 0.0–2.5)
Total CK: 67 U/L (ref 7–177)

## 2015-04-17 MED ORDER — BENZONATATE 200 MG PO CAPS: 200.0000 mg | ORAL_CAPSULE | Freq: Three times a day (TID) | ORAL | Status: DC | PRN

## 2015-04-17 NOTE — Patient Instructions (Addendum)
ICD-9-CM ICD-10-CM   1. Dyspnea and respiratory abnormality 786.09 R06.00     R06.89   2. Chronic cough 786.2 R05     - High clinical suspicion for interstitial lung disease   Plan - Do [facility] ILD question before you leave - For cough try Tessalon cough perles 200 mg 2 times daily as needed - Overnight oxygen study on room air - High resolution CT chest without contrast interstitial lung disease protocol - do full PFT   - Autoimmune profile - Serum: ESR, ACE, ANA, DS-DNA, RF, anti-CCP, ssA, ssB, scl-70, ANCA screen, MPO, PR-3, Total CK,  RNP, Aldolase,  Hypersensitivity Pneumonitis Panel   Followup  = Return to see meor my NP Tammy in the next few weeks after completion of the above.

## 2015-04-17 NOTE — Progress Notes (Signed)
Subjective:    Patient ID: Diane Howell, female    DOB: 19-Apr-1940, 75 y.o.   MRN: [id number] PCP Jani Gravel, MD  HPI  IOV 04/17/2015  Chief Complaint  Patient presents with  . Pulmonary Consult    Pt referred by Dr. Maudie Mercury for [place]. Pt c/o DOE and dry cough. Pt states she was started on Anoro and has noticed a significant difference in her breathing. Pt denies wheezing.    75 year old lady referred for shortness of breath and cough with a presumptive diagnosis of COPD versus interstitial lung disease based on chest x-ray  She worked in [place]  50 years and retired when she was 75 years old approximately 8 years ago. She used to smoke but quit smoking 28 years ago. She has a 33 pack smoking history. She is a widow but quite functional and shuttles between [place] in [place]. Proximately November 2015 she had an episode of acute bronchitis which left her with a severe cough but ultimately she resolved from the cough. Then in 2016 she was mostly bothered by fatigue and exertional shortness of breath for doing household activities and walking long distances. This is persisted all along but it is not worse. Then approximately a month ago her cough returned and it is been severe. She was seen by Dr. Maudie Mercury and started on [facility] approximately 2 weeks ago. This is helping her shortness of breath and fatigue but it does not resolve her cough. The cough is rated as severe level V and it is dry. There is no associated chest tightness. She does have level to shortness of breath when she climbs one flight of stairs. She has chronic back pain and she rates her fatigue as moderate-severe.  Chest x-ray personally visualized shows elevated right hemidiaphragm and ILD findings on . 03/28/2015 at Dr. Julianne Rice office in [facility]  She denies any mold or mildew exposure. Denies having any birds in the house.  She does have a prior history of hiatal hernia but says that a year ago Dr.  Benson Norway did endoscopy on her and cleared her.  Also chart review shows that she had -Chest x-ray 03/01/2014 that is reported as no active process but elevated right hemidiaphragm. I do not have his x-ray -Echocardiogram 03/08/2014 reported as normal ejection fraction with mild mitral regurgitation -Nuclear stress test 03/12/2014-reported as negative for ischemia Dr. Amanda Cockayne - PFT 03/01/2014: reported as moderately severe restrictive lung disease with severe perfusion defect . I personally visualized this PFT trace and shows FVC 1.8 L/64%, FEV1 1.4 L/67%, ratio of 78. No broncho-dilator response. Total lung capacity of 3.6 L/70% and DLCO of 8.1/33%.  Walking desaturation test 185 feet 3 laps: On room air desaturated to 84% on the second lap.   American College of chest physicians interstitial lung disease questionnaire -Symptoms: Severe cough present for one month. Present day and night. It is dry. Shortness of breath when hurrying on level ground or walking up slight hill. -Past medical history: Has dry mouth but otherwise negative. -Personal exposure history previous smoker as stated above. 1 pack a day from age [age]. - Family history of lung disease: Denies -Home exposure history: She has a humidifier but denies any sauna use a hot tub use a Jacuzzi use or birds or water damage or mold or animals. She lives in a house built in [place]. She's lived there for 34 years. Unclear if she uses feathered pillows -Occupational exposure history she check the box l  exposure to MALT but otherwise negative. -Treatment history: Negative for exposure to any potential treatment that can cause interstitial lung disease.   has a past medical history of Hyperlipidemia; GERD (gastroesophageal reflux disease); Diverticulosis; Diaphragm, eventration (right); Hematuria; Fracture of sacrum ([place]); Chronic back pain; and OA (osteoarthritis).   reports that she quit smoking about 28 years ago. Her smoking use included  Cigarettes. She has a 33 pack-year smoking history. She has never used smokeless tobacco.  Past Surgical History  Procedure Laterality Date  . Breast reduction surgery    . Rotator cuff repair Right   . Total abdominal hysterectomy    . Dilation and curettage of uterus      Not on File   There is no immunization history on file for this patient.  Family History  Problem Relation Age of Onset  . Heart attack Father   . Heart failure Mother   . COPD Father   . Diabetes Mellitus II Father   . Breast cancer Mother   . Breast cancer Sister      Current outpatient prescriptions:  .  acetaminophen (TYLENOL) 325 MG tablet, Take 650 mg by mouth every 6 (six) hours as needed., Disp: , Rfl:  .  butalbital-aspirin-caffeine (FIORINAL) 50-325-40 MG capsule, Take 1 capsule by mouth 2 (two) times daily as needed for headache., Disp: , Rfl:  .  dexlansoprazole (DEXILANT) 60 MG capsule, Take 60 mg by mouth daily., Disp: , Rfl:  .  fexofenadine (ALLEGRA) 180 MG tablet, Take 180 mg by mouth daily., Disp: , Rfl:  .  HYDROcodone-acetaminophen (NORCO/VICODIN) 5-325 MG tablet, Take 1 tablet by mouth every 6 (six) hours as needed for moderate pain., Disp: , Rfl:  .  Misc Natural Products (OSTEO BI-FLEX ADV DOUBLE ST PO), Take by mouth daily., Disp: , Rfl:  .  sertraline (ZOLOFT) 25 MG tablet, Take 25 mg by mouth daily., Disp: , Rfl:  .  simvastatin (ZOCOR) 40 MG tablet, Take 40 mg by mouth daily., Disp: , Rfl:  .  umeclidinium-vilanterol (ANORO ELLIPTA) 62.5-25 MCG/INH AEPB, Inhale 1 puff into the lungs daily., Disp: , Rfl:      Review of Systems  Constitutional: Negative for fever and unexpected weight change.  HENT: Negative for congestion, dental problem, ear pain, nosebleeds, postnasal drip, rhinorrhea, sinus pressure, sneezing, sore throat and trouble swallowing.   Eyes: Negative for redness and itching.  Respiratory: Positive for cough and shortness of breath. Negative for chest tightness  and wheezing.   Cardiovascular: Negative for palpitations and leg swelling.  Gastrointestinal: Negative for nausea and vomiting.  Genitourinary: Negative for dysuria.  Musculoskeletal: Negative for joint swelling.  Skin: Negative for rash.  Neurological: Negative for headaches.  Hematological: Does not bruise/bleed easily.  Psychiatric/Behavioral: Negative for dysphoric mood. The patient is not nervous/anxious.        Objective:   Physical Exam  Constitutional: She is oriented to person, place, and time. She appears well-developed and well-nourished. No distress.  HENT:  Head: Normocephalic and atraumatic.  Right Ear: External ear normal.  Left Ear: External ear normal.  Mouth/Throat: Oropharynx is clear and moist. No oropharyngeal exudate.  Eyes: Conjunctivae and EOM are normal. Pupils are equal, round, and reactive to light. Right eye exhibits no discharge. Left eye exhibits no discharge. No scleral icterus.  Neck: Normal range of motion. Neck supple. No JVD present. No tracheal deviation present. No thyromegaly present.  Cardiovascular: Normal rate, regular rhythm, normal heart sounds and intact distal pulses.  Exam reveals  no gallop and no friction rub.   No murmur heard. Pulmonary/Chest: Effort normal. No respiratory distress. She has no wheezes. She has rales. She exhibits no tenderness.  Definite bilateral bibasal crackles  Abdominal: Soft. Bowel sounds are normal. She exhibits no distension and no mass. There is no tenderness. There is no rebound and no guarding.  Musculoskeletal: Normal range of motion. She exhibits no edema or tenderness.  Mild clubbing present  Lymphadenopathy:    She has no cervical adenopathy.  Neurological: She is alert and oriented to person, place, and time. She has normal reflexes. No cranial nerve deficit. She exhibits normal muscle tone. Coordination normal.  Skin: Skin is warm and dry. No rash noted. She is not diaphoretic. No erythema. No pallor.    Psychiatric: She has a normal mood and affect. Her behavior is normal. Judgment and thought content normal.  Vitals reviewed.   Filed Vitals:   04/17/15 1532  BP: 132/84  Pulse: 85  Height: '5\' 4"'  (1.626 m)  Weight: 153 lb 9.6 oz (69.673 kg)  SpO2: 96%         Assessment & Plan:     ICD-9-CM ICD-10-CM   1. Dyspnea and respiratory abnormality 786.09 R06.00     R06.89   2. Chronic cough 786.2 R05    In the presence of cough and shortness of breath associated with restrictive PFTs from 2015 and examined with bilateral bibasal crackles and walking desaturation test showing hypoxemia with exertion. This a high pretest probably defer interstitial lung disease. In the setting of negative autoimmune history age over 58 and a previous smoking history and exam showing mild clubbing most likely. Dealing with idiopathic pulmonary fibrosis. However autoimmune interstitial lung disease is also a possibility in a female along with NSIP and HP (malt and humidifer exposure)   Plan   Plan - Do ACCP ILD question before you leave - For cough try Tessalon cough perles 200 mg 2 times daily as needed - Overnight oxygen study on room air - High resolution CT chest without contrast interstitial lung disease protocol - do full PFT   - Autoimmune profile - Serum: ESR, ACE, ANA, DS-DNA, RF, anti-CCP, ssA, ssB, scl-70, ANCA screen, MPO, PR-3, Total CK,  RNP, Aldolase,  Hypersensitivity Pneumonitis Panel   Followup  = Return to see meor my NP Tammy in the next few weeks after completion of the above. - rec on ILD diagnosis and management including oxygen therapy and rehabilitation at follow-up    Dr. Brand Males, M.D., [facility].C.P Pulmonary and Critical Care Medicine Staff Physician [place] [facility] Pager: [phone], If no answer or between  15:00h - 7:00h: call [phone]  04/17/2015 4:17 PM

## 2015-04-18 LAB — MPO/PR-3 (ANCA) ANTIBODIES

## 2015-04-18 LAB — SEDIMENTATION RATE: Sed Rate: 8 mm/hr (ref 0–22)

## 2015-04-18 LAB — ANA: ANA: NEGATIVE

## 2015-04-18 LAB — RHEUMATOID FACTOR

## 2015-04-18 LAB — ANGIOTENSIN CONVERTING ENZYME: ANGIOTENSIN-CONVERTING ENZYME: 23 U/L (ref 8–52)

## 2015-04-18 LAB — ANCA SCREEN W REFLEX TITER: ANCA SCREEN: NEGATIVE

## 2015-04-18 LAB — SJOGRENS SYNDROME-A EXTRACTABLE NUCLEAR ANTIBODY: SSA (RO) (ENA) ANTIBODY, IGG: NEGATIVE

## 2015-04-18 LAB — ANTI-DNA ANTIBODY, DOUBLE-STRANDED

## 2015-04-18 LAB — SJOGRENS SYNDROME-B EXTRACTABLE NUCLEAR ANTIBODY: SSB (LA) (ENA) ANTIBODY, IGG: NEGATIVE

## 2015-04-18 LAB — RNP ANTIBODY: Ribonucleic Protein(ENA) Antibody, IgG: <1

## 2015-04-18 LAB — ANTI-SCLERODERMA ANTIBODY: Scleroderma (Scl-70) (ENA) Antibody, IgG: <1

## 2015-04-19 ENCOUNTER — Ambulatory Visit (INDEPENDENT_AMBULATORY_CARE_PROVIDER_SITE_OTHER)
Admission: RE | Admit: 2015-04-19 | Discharge: 2015-04-19 | Disposition: A | Discharge status: Home / Self Care | Payer: Medicare Other | Source: Ambulatory Visit | Attending: Internal Medicine | Admitting: Internal Medicine

## 2015-04-19 DIAGNOSIS — R05 Cough: Secondary | ICD-10-CM

## 2015-04-19 DIAGNOSIS — R0689 Other abnormalities of breathing: Secondary | ICD-10-CM | POA: Diagnosis not present

## 2015-04-19 DIAGNOSIS — R0600 Dyspnea, unspecified: Secondary | ICD-10-CM

## 2015-04-19 DIAGNOSIS — R053 Chronic cough: Secondary | ICD-10-CM

## 2015-04-19 DIAGNOSIS — J432 Centrilobular emphysema: Secondary | ICD-10-CM | POA: Diagnosis not present

## 2015-04-19 LAB — ALDOLASE: Aldolase: 4.5 U/L (ref <=8.1)

## 2015-04-21 ENCOUNTER — Telehealth: Payer: Self-pay | Admitting: Internal Medicine

## 2015-04-21 DIAGNOSIS — R0689 Other abnormalities of breathing: Principal | ICD-10-CM

## 2015-04-21 DIAGNOSIS — R0600 Dyspnea, unspecified: Secondary | ICD-10-CM

## 2015-04-21 NOTE — Telephone Encounter (Signed)
Daneil Dan  Pls change her appt to see me 05/06/15 or 05/17/15 with full PFT before seeing me; I am opening office those days  Thanks  Dr. Brand Males, M.D., [facility].C.P Pulmonary and Critical Care Medicine Staff Physician [place] [facility] Pager: [phone], If no answer or between  15:00h - 7:00h: call [phone]  04/21/2015 6:26 AM  '

## 2015-04-22 DIAGNOSIS — R05 Cough: Secondary | ICD-10-CM | POA: Diagnosis not present

## 2015-04-22 DIAGNOSIS — R0600 Dyspnea, unspecified: Secondary | ICD-10-CM | POA: Diagnosis not present

## 2015-04-22 DIAGNOSIS — J989 Respiratory disorder, unspecified: Secondary | ICD-10-CM | POA: Diagnosis not present

## 2015-04-22 LAB — HYPERSENSITIVITY PNEUMONITIS
A. FUMIGATUS #1 ABS: NEGATIVE
A. Pullulans Abs: NEGATIVE
MICROPOLYSPORA FAENI IGG: NEGATIVE
Pigeon Serum Abs: NEGATIVE
Thermoact. Saccharii: NEGATIVE
Thermoactinomyces vulgaris, IgG: NEGATIVE

## 2015-04-23 NOTE — Telephone Encounter (Signed)
Schedule is still not available

## 2015-04-24 NOTE — Telephone Encounter (Signed)
Called and spoke to pt. Appt made with MR and PFT on 05/06/15. Pt verbalized understanding and denied any further questions or concerns at this time.

## 2015-04-25 ENCOUNTER — Telehealth: Payer: Self-pay | Admitting: Internal Medicine

## 2015-04-25 DIAGNOSIS — Z1389 Encounter for screening for other disorder: Secondary | ICD-10-CM | POA: Diagnosis not present

## 2015-04-25 DIAGNOSIS — R0689 Other abnormalities of breathing: Principal | ICD-10-CM

## 2015-04-25 DIAGNOSIS — J849 Interstitial pulmonary disease, unspecified: Secondary | ICD-10-CM | POA: Diagnosis not present

## 2015-04-25 DIAGNOSIS — E559 Vitamin D deficiency, unspecified: Secondary | ICD-10-CM | POA: Diagnosis not present

## 2015-04-25 DIAGNOSIS — R0600 Dyspnea, unspecified: Secondary | ICD-10-CM

## 2015-04-25 DIAGNOSIS — E7800 Pure hypercholesterolemia, unspecified: Secondary | ICD-10-CM | POA: Diagnosis not present

## 2015-04-25 NOTE — Telephone Encounter (Signed)
Per MR-  Pt desat during ONO on 04/17/15, lowest spO2 was 75%, pt will ned 2lpm nocturnal O2.   Called and spoke to pt. Informed her of the results and recs per MR. Order placed for O2. Pt verbalized understanding and denied any further questions or concerns at this time.

## 2015-04-26 DIAGNOSIS — J84112 Idiopathic pulmonary fibrosis: Secondary | ICD-10-CM | POA: Diagnosis not present

## 2015-04-26 DIAGNOSIS — R0689 Other abnormalities of breathing: Secondary | ICD-10-CM | POA: Diagnosis not present

## 2015-05-06 ENCOUNTER — Ambulatory Visit (HOSPITAL_COMMUNITY)
Admission: RE | Admit: 2015-05-06 | Discharge: 2015-05-06 | Disposition: A | Discharge status: Home / Self Care | Payer: Medicare Other | Source: Ambulatory Visit | Attending: Internal Medicine | Admitting: Internal Medicine

## 2015-05-06 ENCOUNTER — Ambulatory Visit (INDEPENDENT_AMBULATORY_CARE_PROVIDER_SITE_OTHER): Payer: Medicare Other | Admitting: Internal Medicine

## 2015-05-06 ENCOUNTER — Encounter: Payer: Self-pay | Admitting: Internal Medicine

## 2015-05-06 VITALS — BP 108/60 | HR 106 | Ht 64.0 in | Wt 152.0 lb

## 2015-05-06 DIAGNOSIS — R0689 Other abnormalities of breathing: Secondary | ICD-10-CM | POA: Diagnosis not present

## 2015-05-06 DIAGNOSIS — J849 Interstitial pulmonary disease, unspecified: Secondary | ICD-10-CM | POA: Diagnosis not present

## 2015-05-06 DIAGNOSIS — R0600 Dyspnea, unspecified: Secondary | ICD-10-CM | POA: Diagnosis not present

## 2015-05-06 LAB — PULMONARY FUNCTION TEST
DL/VA % pred: 60 %
DL/VA: 2.89 ml/min/mmHg/L
DLCO UNC: 8.41 ml/min/mmHg
DLCO unc % pred: 34 %
FEF 25-75 POST: 1.6 L/s
FEF 25-75 Pre: 1.57 L/sec
FEF2575-%Change-Post: 2 %
FEF2575-%Pred-Post: 102 %
FEF2575-%Pred-Pre: 100 %
FEV1-%CHANGE-POST: 1 %
FEV1-%PRED-POST: 75 %
FEV1-%PRED-PRE: 73 %
FEV1-POST: 1.54 L
FEV1-PRE: 1.52 L
FEV1FVC-%CHANGE-POST: 0 %
FEV1FVC-%Pred-Pre: 109 %
FEV6-%Change-Post: 2 %
FEV6-%PRED-POST: 73 %
FEV6-%PRED-PRE: 71 %
FEV6-PRE: 1.86 L
FEV6-Post: 1.92 L
FEV6FVC-%Change-Post: 0 %
FEV6FVC-%PRED-PRE: 105 %
FEV6FVC-%Pred-Post: 105 %
FVC-%Change-Post: 2 %
FVC-%Pred-Post: 69 %
FVC-%Pred-Pre: 68 %
FVC-Post: 1.92 L
FVC-Pre: 1.87 L
POST FEV1/FVC RATIO: 81 %
PRE FEV6/FVC RATIO: 100 %
Post FEV6/FVC ratio: 100 %
Pre FEV1/FVC ratio: 81 %
RV % PRED: 64 %
RV: 1.51 L
TLC % PRED: 68 %
TLC: 3.45 L

## 2015-05-06 MED ORDER — ALBUTEROL SULFATE (2.5 MG/3ML) 0.083% IN NEBU
2.5000 mg | INHALATION_SOLUTION | Freq: Once | RESPIRATORY_TRACT | Status: AC
  Administered 2015-05-06: 2.5 mg via RESPIRATORY_TRACT

## 2015-05-06 NOTE — Progress Notes (Signed)
Subjective:     Patient ID: Diane Howell, female   DOB: 1940/08/12, 75 y.o.   MRN: [id number]  HPI    IOV 04/17/2015  Chief Complaint  Patient presents with  . Pulmonary Consult    Pt referred by Dr. Maudie Mercury for [place]. Pt c/o DOE and dry cough. Pt states she was started on Anoro and has noticed a significant difference in her breathing. Pt denies wheezing.    75 year old lady referred for shortness of breath and cough with a presumptive diagnosis of COPD versus interstitial lung disease based on chest x-ray  She worked in [place]  50 years and retired when she was 75 years old approximately 8 years ago. She used to smoke but quit smoking 28 years ago. She has a 33 pack smoking history. She is a widow but quite functional and shuttles between [place] in [place]. Proximately November 2015 she had an episode of acute bronchitis which left her with a severe cough but ultimately she resolved from the cough. Then in 2016 she was mostly bothered by fatigue and exertional shortness of breath for doing household activities and walking long distances. This is persisted all along but it is not worse. Then approximately a month ago her cough returned and it is been severe. She was seen by Dr. Maudie Mercury and started on [facility] approximately 2 weeks ago. This is helping her shortness of breath and fatigue but it does not resolve her cough. The cough is rated as severe level V and it is dry. There is no associated chest tightness. She does have level to shortness of breath when she climbs one flight of stairs. She has chronic back pain and she rates her fatigue as moderate-severe.  Chest x-ray personally visualized shows elevated right hemidiaphragm and ILD findings on . 03/28/2015 at Dr. Julianne Rice office in [facility]  She denies any mold or mildew exposure. Denies having any birds in the house.  She does have a prior history of hiatal hernia but says that a year ago Dr. Benson Norway did endoscopy on  her and cleared her.  Also chart review shows that she had -Chest x-ray 03/01/2014 that is reported as no active process but elevated right hemidiaphragm. I do not have his x-ray -Echocardiogram 03/08/2014 reported as normal ejection fraction with mild mitral regurgitation -Nuclear stress test 03/12/2014-reported as negative for ischemia Dr. Amanda Cockayne - PFT 03/01/2014: reported as moderately severe restrictive lung disease with severe perfusion defect . I personally visualized this PFT trace and shows FVC 1.8 L/64%, FEV1 1.4 L/67%, ratio of 78. No broncho-dilator response. Total lung capacity of 3.6 L/70% and DLCO of 8.1/33%.  Walking desaturation test 185 feet 3 laps: On room air desaturated to 84% on the second lap.   American College of chest physicians interstitial lung disease questionnaire -Symptoms: Severe cough present for one month. Present day and night. It is dry. Shortness of breath when hurrying on level ground or walking up slight hill. -Past medical history: Has dry mouth but otherwise negative. -Personal exposure history previous smoker as stated above. 1 pack a day from age [age]. - Family history of lung disease: Denies -Home exposure history: She has a humidifier but denies any sauna use a hot tub use a Jacuzzi use or birds or water damage or mold or animals. She lives in a house built in [place]. She's lived there for 21 years. Unclear if she uses feathered pillows -Occupational exposure history she check the box l exposure to MALT  but otherwise negative. -Treatment history: Negative for exposure to any potential treatment that can cause interstitial lung disease.  OV 05/06/2015  Chief Complaint  Patient presents with  . Follow-up    Pt here after HRCT, PFT, and labs. Pt states she her breathing has improved since last OV and her cough has improved but not resolved. Pt states she has recently started having hot flashes.    She is here to review her results. According to  radiologist thoracic Dr. Salvatore Marvel " I strongly favor NSIP based on immediate subleural sparing and lack of progression over 6 years. However there are no disqualifying features for UIP, so technically it is "possible UIP""   Autoimmune and hypersensitivity pneumonitis panel 04/17/2015 is negative  Pulmonary function test between 03/01/2014 and 05/06/2015 is unchanged with an FVC of 1.87 L/68%. Total lung capacity of 3.45/68% and DLCO of 8.4/34%. Currently.   She continues to be symptomatic although after using her nocturnal oxygen she feels more energetic although she's having some hot flashes   Current outpatient prescriptions:  .  acetaminophen (TYLENOL) 325 MG tablet, Take 650 mg by mouth every 6 (six) hours as needed., Disp: , Rfl:  .  benzonatate (TESSALON) 200 MG capsule, Take 1 capsule (200 mg total) by mouth 3 (three) times daily as needed for cough., Disp: 30 capsule, Rfl: 1 .  butalbital-aspirin-caffeine (FIORINAL) 50-325-40 MG capsule, Take 1 capsule by mouth 2 (two) times daily as needed for headache., Disp: , Rfl:  .  dexlansoprazole (DEXILANT) 60 MG capsule, Take 60 mg by mouth daily., Disp: , Rfl:  .  fexofenadine (ALLEGRA) 180 MG tablet, Take 180 mg by mouth daily., Disp: , Rfl:  .  HYDROcodone-acetaminophen (NORCO/VICODIN) 5-325 MG tablet, Take 1 tablet by mouth every 6 (six) hours as needed for moderate pain., Disp: , Rfl:  .  Misc Natural Products (OSTEO BI-FLEX ADV DOUBLE ST PO), Take by mouth daily., Disp: , Rfl:  .  sertraline (ZOLOFT) 25 MG tablet, Take 25 mg by mouth daily., Disp: , Rfl:  .  simvastatin (ZOCOR) 40 MG tablet, Take 40 mg by mouth daily., Disp: , Rfl:  .  umeclidinium-vilanterol (ANORO ELLIPTA) 62.5-25 MCG/INH AEPB, Inhale 1 puff into the lungs daily., Disp: , Rfl:        Not on File   There is no immunization history on file for this patient.   reports that she quit smoking about 28 years ago. Her smoking use included Cigarettes. She has a 33  pack-year smoking history. She has never used smokeless tobacco.    Review of Systems     Objective:   Physical Exam Filed Vitals:   05/06/15 1217  BP: 108/60  Pulse: 106  Height: 5\' 4"  (1.626 m)  Weight: 152 lb (68.947 kg)  SpO2: 96%   discusisioon only     Assessment:       ICD-9-CM ICD-10-CM   1. ILD (interstitial lung disease) ([place]) 515 J84.9 Ambulatory referral to Cardiothoracic Surgery       Plan:        ICD-9-CM ICD-10-CM   1. ILD (interstitial lung disease) ([place]) 515 J84.9 Ambulatory referral to Cardiothoracic Surgery    Overall stable on CT since 2011 and PFT test since 2015   You have ILD type lung problem. 3 most likely possibilities are  -  NSIP - based on age, gender and stability and CT chest findings  - IPF - based on age and gender although it is not progressive and  is against IPF  - Hypersensitivity Pneumonitis - based on exposure history and on personal anecdotal experience that even on lower lobes chronic hypersensitivity pneumonitis can persists   She meets indication for surgical lung biopsy. We discussed the risks, benefits and limitations. She is interested in having this procedure.  Followup - refer Dr Roxan Hockey for surgical lung biopsy evaluation  (> 50% of this 15 min visit spent in face to face counseling or/and coordination of care)   Dr. Brand Males, M.D., [facility].C.P Pulmonary and Critical Care Medicine Staff Physician [place] [facility] Pager: [phone], If no answer or between  15:00h - 7:00h: call [phone]  05/06/2015 6:13 PM

## 2015-05-06 NOTE — Patient Instructions (Signed)
ICD-9-CM ICD-10-CM   1. ILD (interstitial lung disease) ([place]) 515 J84.9     You have ILD type lung problem. 3 most likely possibilities are NSIP, v IPF v Hypersensitivity Pneumonitis Overall stable on CT since 2011 and PFT test since 2015  Followup - refer Dr Roxan Hockey for surgical lung biopsy evaluation

## 2015-05-08 DIAGNOSIS — R197 Diarrhea, unspecified: Secondary | ICD-10-CM | POA: Diagnosis not present

## 2015-05-09 ENCOUNTER — Institutional Professional Consult (permissible substitution) (INDEPENDENT_AMBULATORY_CARE_PROVIDER_SITE_OTHER): Payer: Medicare Other | Admitting: Thoracic Surgery (Cardiothoracic Vascular Surgery)

## 2015-05-09 ENCOUNTER — Encounter: Payer: Self-pay | Admitting: Thoracic Surgery (Cardiothoracic Vascular Surgery)

## 2015-05-09 VITALS — BP 144/72 | HR 86 | Resp 16 | Ht 64.0 in | Wt 152.0 lb

## 2015-05-09 DIAGNOSIS — J986 Disorders of diaphragm: Secondary | ICD-10-CM

## 2015-05-09 DIAGNOSIS — J849 Interstitial pulmonary disease, unspecified: Secondary | ICD-10-CM | POA: Diagnosis not present

## 2015-05-09 NOTE — Progress Notes (Signed)
PCP is Jani Gravel, MD Referring Provider is Brand Males, MD  Chief Complaint  Patient presents with  . Shortness of Breath    ILD...EVAL FOR BX.Marland KitchenMarland KitchenCT CHEST HI RESOLUTION.Marland KitchenMarland Kitchen2/17/17    HPI: Diane Howell is a 75 year old woman who was sent for consideration of lung biopsy for interstitial lung disease.  Diane Howell is a 75 year old woman with past medical history significant for eventration of the right hemidiaphragm, hyperlipidemia, gastroesophageal reflux, osteoporosis, fracture of sacrum, osteoarthritis, and chronic back pain. She has a remote history of tobacco abuse and smoked 1 pack a day for 33 years before quitting in 1988.   She had an episode of severe bronchitis in November 2015. She remembers having a cough for a long time after that but it ultimately resolved. Last fall she began to notice fatigue and exertional shortness of breath when walking or doing chores around the house. About 2 months ago her cough returned. This was initially a dry cough but then became productive. She has not had any hemoptysis.   Dr. Maudie Mercury started her on [facility] a few weeks ago. It helped her shortness of breath and fatigue but it did not resolve her cough. She still has shortness of breath with exertion, but fatigue is more of a problem to her presently. She has oxygen at home, but does not use it all the time. She has chronic back pain.  Zubrod Score: At the time of surgery this patient's most appropriate activity status/level should be described as: []     0    Normal activity, no symptoms [x]     1    Restricted in physical strenuous activity but ambulatory, able to do out light work []     2    Ambulatory and capable of self care, unable to do work activities, up and about >50 % of waking hours                              []     3    Only limited self care, in bed greater than 50% of waking hours []     4    Completely disabled, no self care, confined to bed or chair []     5    Moribund  Past Medical  History  Diagnosis Date  . Hyperlipidemia   . GERD (gastroesophageal reflux disease)   . Diverticulosis   . Diaphragm, eventration right  . Hematuria   . Fracture of sacrum ([place])   . Chronic back pain   . OA (osteoarthritis)     Past Surgical History  Procedure Laterality Date  . Breast reduction surgery    . Rotator cuff repair Right   . Total abdominal hysterectomy    . Dilation and curettage of uterus      Family History  Problem Relation Age of Onset  . Heart attack Father   . Heart failure Mother   . COPD Father   . Diabetes Mellitus II Father   . Breast cancer Mother   . Breast cancer Sister     Social History Social History  Substance Use Topics  . Smoking status: Former Smoker -- 1.00 packs/day for 33 years    Types: Cigarettes    Quit date: 07/04/1986  . Smokeless tobacco: Never Used  . Alcohol Use: 0.0 oz/week    0 Standard drinks or equivalent per week     Comment: occasional    Current Outpatient Prescriptions  Medication Sig  Dispense Refill  . acetaminophen (TYLENOL) 325 MG tablet Take 650 mg by mouth every 6 (six) hours as needed.    . benzonatate (TESSALON) 200 MG capsule Take 1 capsule (200 mg total) by mouth 3 (three) times daily as needed for cough. 30 capsule 1  . butalbital-aspirin-caffeine (FIORINAL) 50-325-40 MG capsule Take 1 capsule by mouth 2 (two) times daily as needed for headache.    . dexlansoprazole (DEXILANT) 60 MG capsule Take 60 mg by mouth daily.    . diazepam (VALIUM) 5 MG tablet Take 5 mg by mouth every 6 (six) hours as needed for anxiety.    . fexofenadine (ALLEGRA) 180 MG tablet Take 180 mg by mouth daily.    Marland Kitchen HYDROcodone-acetaminophen (NORCO/VICODIN) 5-325 MG tablet Take 1 tablet by mouth every 6 (six) hours as needed for moderate pain.    . Misc Natural Products (OSTEO BI-FLEX ADV DOUBLE ST PO) Take by mouth daily.    . sertraline (ZOLOFT) 25 MG tablet Take 25 mg by mouth daily.    . simvastatin (ZOCOR) 40 MG tablet Take 40  mg by mouth daily.    Marland Kitchen umeclidinium-vilanterol (ANORO ELLIPTA) 62.5-25 MCG/INH AEPB Inhale 1 puff into the lungs daily.     No current facility-administered medications for this visit.    Not on File  Review of Systems  Constitutional: Positive for appetite change and fatigue. Negative for unexpected weight change.  HENT: Positive for dental problem (Dentures). Negative for trouble swallowing and voice change.   Respiratory: Positive for cough, shortness of breath and wheezing. Negative for chest tightness.   Cardiovascular: Negative for chest pain, palpitations and leg swelling.  Gastrointestinal: Negative for abdominal pain and blood in stool.  Genitourinary: Negative for hematuria and difficulty urinating.  Musculoskeletal: Positive for back pain and arthralgias.  Neurological: Positive for headaches. Negative for syncope and weakness.  Hematological: Negative for adenopathy. Does not bruise/bleed easily.  Psychiatric/Behavioral: Positive for dysphoric mood. The patient is nervous/anxious.   All other systems reviewed and are negative.   BP 144/72 mmHg  Pulse 86  Resp 16  Ht 5\' 4"  (1.626 m)  Wt 152 lb (68.947 kg)  BMI 26.08 kg/m2  SpO2 95% Physical Exam  Constitutional: She is oriented to person, place, and time. She appears well-developed and well-nourished.  HENT:  Head: Normocephalic and atraumatic.  Mouth/Throat: No oropharyngeal exudate.  Eyes: Conjunctivae and EOM are normal. No scleral icterus.  Neck: Neck supple. No thyromegaly present.  Cardiovascular: Normal rate, regular rhythm, normal heart sounds and intact distal pulses.   No murmur heard. Pulmonary/Chest: Effort normal. No respiratory distress. She has no wheezes. She has no rales.  Diminished breath sounds right base  Abdominal: Soft. She exhibits no distension. There is no tenderness.  Musculoskeletal: She exhibits no edema.  Lymphadenopathy:    She has no cervical adenopathy.  Neurological: She is  alert and oriented to person, place, and time. No cranial nerve deficit.  No focal motor deficit  Skin: Skin is warm and dry.  Vitals reviewed.    Diagnostic Tests:  CT CHEST WITHOUT CONTRAST  TECHNIQUE: Multidetector CT imaging of the chest was performed following the standard protocol without intravenous contrast. High resolution imaging of the lungs, as well as inspiratory and expiratory imaging, was performed.  COMPARISON: 03/28/2015 chest radiograph. 07/10/2009 chest CT.  FINDINGS: Mediastinum/Nodes: Normal heart size. No pericardial fluid/thickening. Left anterior descending and right coronary atherosclerosis. Great vessels are normal in course and caliber. Normal visualized thyroid. Normal esophagus. No pathologically  enlarged axillary, mediastinal or gross hilar lymph nodes, noting limited sensitivity for the detection of hilar adenopathy on this noncontrast study.  Lungs/Pleura: No pneumothorax. No pleural effusion. Right middle lobe 3 mm pulmonary nodule (series 5/ image 32) is stable since 07/10/2009 and benign. No acute consolidative airspace disease, new significant pulmonary nodules or lung masses. Mild centrilobular emphysema. There is patchy peripheral reticulation throughout both lungs, without a clear basilar gradient, which spares the immediate subpleural lung. No significant regions of traction bronchiectasis, ground-glass attenuation, architectural distortion, parenchymal banding or frank honeycombing. Findings have not significantly progressed since 07/10/2009. No evidence of significant air trapping on the expiration sequence. There is tracheobronchomalacia on the expiration sequence, with near complete collapse of the tracheal lumen on the expiration sequence (AP tracheal luminal diameter 2.3 cm on the inspiration sequence and 0.2 cm on the expiration sequence.  Upper abdomen: Simple 1.2 cm peripheral right liver lobe cyst. Simple 1.0 cm  medial segment left liver lobe cyst. Lateral segment left liver lobe 2.5 cm cyst with mural calcification, significantly decreased in size from 5.9 cm on 07/10/2009, in keeping with a benign cyst. Stable moderate elevation of the right hemidiaphragm.  Musculoskeletal: No aggressive appearing focal osseous lesions. Mild degenerative changes in the thoracic spine.  IMPRESSION: 1. Interstitial lung disease characterized by peripheral reticulation throughout both lungs sparing the immediate subpleural lung, not appreciably progressed since 07/10/2009, in keeping with nonspecific interstitial pneumonia (NSIP). 2. Tracheobronchomalacia. 3. Mild centrilobular emphysema. 4. Stable moderate elevation of the right hemidiaphragm. 5. Two-vessel coronary atherosclerosis.   Electronically Signed  By: Ilona Sorrel M.D.  On: 04/20/2015 11:12  I personally reviewed the CT chest and concur with the findings noted above.  Impression: 76 year old woman with interstitial lung disease of unknown etiology. She needs a lung biopsy for diagnostic purposes.  I discussed the proposed procedure of left VATS, lung biopsy, possible bronchoscopy with Diane Howell. I informed her of the need for general anesthesia, the incisions to be used, use of drainage tubes postoperatively, the expected hospital stay and the overall recovery. She understands this is a diagnostic procedure not therapeutic. I informed her the indications, risks, benefits, and alternatives. She understands the risks include, but are not limited to death, MI, DVT, PE, bleeding, possible need for transfusion, infection, prolonged air leak, cardiac arrhythmias, as well as the possibility of other unforeseeable complications.  She does have evidence of coronary disease on CT, but has no anginal symptoms. I do not think any further workup is necessary for that preoperatively.  She accepts the risks and wishes to proceed.  Plan: Left VATS, lung  biopsy, possible bronchoscopy (should she opt to participate in the clinical trial)  Melrose Nakayama, MD [facility] [phone]

## 2015-05-10 ENCOUNTER — Other Ambulatory Visit: Payer: Self-pay | Admitting: *Deleted

## 2015-05-10 DIAGNOSIS — J849 Interstitial pulmonary disease, unspecified: Secondary | ICD-10-CM

## 2015-05-17 DIAGNOSIS — S39012A Strain of muscle, fascia and tendon of lower back, initial encounter: Secondary | ICD-10-CM | POA: Diagnosis not present

## 2015-05-21 ENCOUNTER — Ambulatory Visit: Payer: Medicare Other | Admitting: Adult Health

## 2015-05-24 ENCOUNTER — Encounter: Payer: Self-pay | Admitting: Internal Medicine

## 2015-05-24 DIAGNOSIS — J84112 Idiopathic pulmonary fibrosis: Secondary | ICD-10-CM | POA: Diagnosis not present

## 2015-05-24 DIAGNOSIS — R0689 Other abnormalities of breathing: Secondary | ICD-10-CM | POA: Diagnosis not present

## 2015-05-28 ENCOUNTER — Encounter (HOSPITAL_COMMUNITY)
Admission: RE | Admit: 2015-05-28 | Discharge: 2015-05-28 | Disposition: A | Discharge status: Home / Self Care | Payer: Medicare Other | Source: Ambulatory Visit | Attending: Thoracic Surgery (Cardiothoracic Vascular Surgery) | Admitting: Thoracic Surgery (Cardiothoracic Vascular Surgery)

## 2015-05-28 ENCOUNTER — Ambulatory Visit (HOSPITAL_COMMUNITY)
Admission: RE | Admit: 2015-05-28 | Discharge: 2015-05-28 | Disposition: A | Discharge status: Home / Self Care | Payer: Medicare Other | Source: Ambulatory Visit | Attending: Thoracic Surgery (Cardiothoracic Vascular Surgery) | Admitting: Thoracic Surgery (Cardiothoracic Vascular Surgery)

## 2015-05-28 ENCOUNTER — Encounter (HOSPITAL_COMMUNITY): Payer: Self-pay

## 2015-05-28 VITALS — BP 120/58 | HR 77 | Temp 97.7°F | Resp 18 | Ht 65.0 in | Wt 150.9 lb

## 2015-05-28 DIAGNOSIS — Z79899 Other long term (current) drug therapy: Secondary | ICD-10-CM

## 2015-05-28 DIAGNOSIS — R9431 Abnormal electrocardiogram [ECG] [EKG]: Secondary | ICD-10-CM | POA: Insufficient documentation

## 2015-05-28 DIAGNOSIS — J449 Chronic obstructive pulmonary disease, unspecified: Secondary | ICD-10-CM | POA: Diagnosis present

## 2015-05-28 DIAGNOSIS — M810 Age-related osteoporosis without current pathological fracture: Secondary | ICD-10-CM | POA: Diagnosis present

## 2015-05-28 DIAGNOSIS — M549 Dorsalgia, unspecified: Secondary | ICD-10-CM | POA: Diagnosis not present

## 2015-05-28 DIAGNOSIS — G8929 Other chronic pain: Secondary | ICD-10-CM | POA: Diagnosis not present

## 2015-05-28 DIAGNOSIS — I10 Essential (primary) hypertension: Secondary | ICD-10-CM | POA: Diagnosis not present

## 2015-05-28 DIAGNOSIS — Z01812 Encounter for preprocedural laboratory examination: Secondary | ICD-10-CM | POA: Insufficient documentation

## 2015-05-28 DIAGNOSIS — J849 Interstitial pulmonary disease, unspecified: Secondary | ICD-10-CM

## 2015-05-28 DIAGNOSIS — Z01810 Encounter for preprocedural cardiovascular examination: Secondary | ICD-10-CM

## 2015-05-28 DIAGNOSIS — Z87891 Personal history of nicotine dependence: Secondary | ICD-10-CM

## 2015-05-28 DIAGNOSIS — E785 Hyperlipidemia, unspecified: Secondary | ICD-10-CM | POA: Diagnosis not present

## 2015-05-28 DIAGNOSIS — I2510 Atherosclerotic heart disease of native coronary artery without angina pectoris: Secondary | ICD-10-CM | POA: Diagnosis not present

## 2015-05-28 DIAGNOSIS — Z96641 Presence of right artificial hip joint: Secondary | ICD-10-CM | POA: Diagnosis not present

## 2015-05-28 DIAGNOSIS — F329 Major depressive disorder, single episode, unspecified: Secondary | ICD-10-CM | POA: Diagnosis present

## 2015-05-28 DIAGNOSIS — I272 Other secondary pulmonary hypertension: Secondary | ICD-10-CM | POA: Diagnosis not present

## 2015-05-28 DIAGNOSIS — Z01818 Encounter for other preprocedural examination: Secondary | ICD-10-CM

## 2015-05-28 HISTORY — DX: Pneumonia, unspecified organism: J18.9

## 2015-05-28 HISTORY — DX: Major depressive disorder, single episode, unspecified: F32.9

## 2015-05-28 HISTORY — DX: Depression, unspecified: F32.A

## 2015-05-28 HISTORY — DX: Personal history of other diseases of the digestive system: Z87.19

## 2015-05-28 HISTORY — DX: Anxiety disorder, unspecified: F41.9

## 2015-05-28 HISTORY — DX: Chronic obstructive pulmonary disease, unspecified: J44.9

## 2015-05-28 LAB — BLOOD GAS, ARTERIAL
Acid-Base Excess: 1.5 mmol/L (ref 0.0–2.0)
Bicarbonate: 25.2 mEq/L — ABNORMAL HIGH (ref 20.0–24.0)
Drawn by: 42180
FIO2: 0.21
O2 SAT: 96.4 %
PATIENT TEMPERATURE: 98.6
PH ART: 7.443 (ref 7.350–7.450)
TCO2: 26.4 mmol/L (ref 0–100)
pCO2 arterial: 37.5 mmHg (ref 35.0–45.0)
pO2, Arterial: 84.4 mmHg (ref 80.0–100.0)

## 2015-05-28 LAB — COMPREHENSIVE METABOLIC PANEL
ALBUMIN: 4.1 g/dL (ref 3.5–5.0)
ALK PHOS: 77 U/L (ref 38–126)
ALT: 13 U/L — AB (ref 14–54)
ANION GAP: 13 (ref 5–15)
AST: 20 U/L (ref 15–41)
BILIRUBIN TOTAL: 0.5 mg/dL (ref 0.3–1.2)
BUN: 6 mg/dL (ref 6–20)
CALCIUM: 8.8 mg/dL — AB (ref 8.9–10.3)
CO2: 23 mmol/L (ref 22–32)
CREATININE: 0.71 mg/dL (ref 0.44–1.00)
Chloride: 107 mmol/L (ref 101–111)
GFR calc Af Amer: >60 mL/min (ref >60)
GFR calc non Af Amer: >60 mL/min (ref >60)
GLUCOSE: 103 mg/dL — AB (ref 65–99)
Potassium: 3.6 mmol/L (ref 3.5–5.1)
SODIUM: 143 mmol/L (ref 135–145)
Total Protein: 6.6 g/dL (ref 6.5–8.1)

## 2015-05-28 LAB — CBC
HEMATOCRIT: 38.5 % (ref 36.0–46.0)
HEMOGLOBIN: 13.2 g/dL (ref 12.0–15.0)
MCH: 31.6 pg (ref 26.0–34.0)
MCHC: 34.3 g/dL (ref 30.0–36.0)
MCV: 92.1 fL (ref 78.0–100.0)
Platelets: 243 10*3/uL (ref 150–400)
RBC: 4.18 MIL/uL (ref 3.87–5.11)
RDW: 13 % (ref 11.5–15.5)
WBC: 11.7 10*3/uL — AB (ref 4.0–10.5)

## 2015-05-28 LAB — ABO/RH: ABO/RH(D): O NEG

## 2015-05-28 LAB — URINALYSIS, ROUTINE W REFLEX MICROSCOPIC
Bilirubin Urine: NEGATIVE
GLUCOSE, UA: NEGATIVE mg/dL
Ketones, ur: NEGATIVE mg/dL
Leukocytes, UA: NEGATIVE
Nitrite: NEGATIVE
PH: 6 (ref 5.0–8.0)
PROTEIN: NEGATIVE mg/dL
Specific Gravity, Urine: 1.011 (ref 1.005–1.030)

## 2015-05-28 LAB — APTT: aPTT: 26 seconds (ref 24–37)

## 2015-05-28 LAB — PROTIME-INR
INR: 0.95 (ref 0.00–1.49)
PROTHROMBIN TIME: 12.8 s (ref 11.6–15.2)

## 2015-05-28 LAB — SURGICAL PCR SCREEN
MRSA, PCR: NEGATIVE
Staphylococcus aureus: NEGATIVE

## 2015-05-28 LAB — URINE MICROSCOPIC-ADD ON

## 2015-05-28 LAB — TYPE AND SCREEN
ABO/RH(D): O NEG
Antibody Screen: NEGATIVE

## 2015-05-28 NOTE — Pre-Procedure Instructions (Addendum)
Diane Howell  05/28/2015      [id number] [place], [place] [phone] [facility] [address] [place] [address] Phone: [phone] Fax: [phone]    Your procedure is scheduled on 05/30/15  Report to [facility] Admitting at 545 A.M.  Call this number if you have problems the morning of surgery:  [phone]   Remember:  Do not eat food or drink liquids after midnight.  Take these medicines the morning of surgery with A SIP OF WATER dexilant, valium and pain med if needed, inhaler if needed  STOP all herbel meds, nsaids (aleve,naproxen,advil,ibuprofen) starting today including osteo-biflex, fiorinal, aspirin, vitamins   Do not wear jewelry, make-up or nail polish.  Do not wear lotions, powders, or perfumes.  You may wear deodorant.  Do not shave 48 hours prior to surgery.  Men may shave face and neck.  Do not bring valuables to the hospital.  [facility] is not responsible for any belongings or valuables.  Contacts, dentures or bridgework may not be worn into surgery.  Leave your suitcase in the car.  After surgery it may be brought to your room.  For patients admitted to the hospital, discharge time will be determined by your treatment team.  Patients discharged the day of surgery will not be allowed to drive home.   Name and phone number of your driver:    Special instructions:   Special Instructions: Onset - Preparing for Surgery  Before surgery, you can play an important role.  Because skin is not sterile, your skin needs to be as free of germs as possible.  You can reduce the number of germs on you skin by washing with CHG (chlorahexidine gluconate) soap before surgery.  CHG is an antiseptic cleaner which kills germs and bonds with the skin to continue killing germs even after washing.  Please DO NOT use if you have an allergy to CHG or antibacterial soaps.  If your skin becomes reddened/irritated stop  using the CHG and inform your nurse when you arrive at Short Stay.  Do not shave (including legs and underarms) for at least 48 hours prior to the first CHG shower.  You may shave your face.  Please follow these instructions carefully:   1.  Shower with CHG Soap the night before surgery and the morning of Surgery.  2.  If you choose to wash your hair, wash your hair first as usual with your normal shampoo.  3.  After you shampoo, rinse your hair and body thoroughly to remove the Shampoo.  4.  Use CHG as you would any other liquid soap.  You can apply chg directly  to the skin and wash gently with scrungie or a clean washcloth.  5.  Apply the CHG Soap to your body ONLY FROM THE NECK DOWN.  Do not use on open wounds or open sores.  Avoid contact with your eyes ears, mouth and genitals (private parts).  Wash genitals (private parts)       with your normal soap.  6.  Wash thoroughly, paying special attention to the area where your surgery will be performed.  7.  Thoroughly rinse your body with warm water from the neck down.  8.  DO NOT shower/wash with your normal soap after using and rinsing off the CHG Soap.  9.  Pat yourself dry with a clean towel.  10.  Wear clean pajamas.            11.  Place clean sheets on your bed the night of your first shower and do not sleep with pets.  Day of Surgery  Do not apply any lotions/deodorants the morning of surgery.  Please wear clean clothes to the hospital/surgery center.  Please read over the following fact sheets that you were given. Pain Booklet, Coughing and Deep Breathing, Blood Transfusion Information, MRSA Information and Surgical Site Infection Prevention

## 2015-05-28 NOTE — Progress Notes (Addendum)
Anesthesia PAT Evaluation: Patient is a 75 year old female scheduled for left VATS, lung biopsy, possible video bronchoscopy on 05/30/15 by Dr. Roxan Hockey. Patient with finding of ILD of unknown etiology. Lung biopsy recommended for diagnostic purposes.  History includes former smoker, ILD, COPD with nocturnal O2 (2L/[place]), right hemidiaphragm elevation, dentures (upper and lower), HLD, hiatal hernia, anxiety, depression, GERD, hematuria, chronic back pain, breast reduction, hysterectomy, tonsillectomy, right THA, vascular surgery (not specified).   PCP is Dr. Jani Gravel.  Pulmonologist is Dr. Chase Caller. She is not routinely followed by cardiology, but states she was referred to Dr. Einar Gip around 03/2014 for cardiac testing which was reported as normal (records pending).  Meds includes Dexilant, Valium, Allegra, Norco, Zocor, Anoro Ellipta, Ascomp-Codeine.  PAT Vitals: BP 120/58, HR 77, RR 18, T 36.5C, O2 sat 97%. Exam show a pleasant Caucasian female in NAD. Heart RRR, no murmur noted. Lungs clear. No carotid bruits noted. No ankle edema. Adequate mouth opening. Upper and lower dentures.    05/28/15 EKG: NSR, possible LAE, septal infarct (age undetermined). Q wave in V2 is not seen on her 03/05/08 tracing in [place]. She denied chest pain or SOB at rest.   According to Dr. Golden Pop 05/06/15 note: "-Echocardiogram 03/08/2014 reported as normal ejection fraction with mild mitral regurgitation -Nuclear stress test 03/12/2014-reported as negative for ischemia Dr. Amanda Cockayne - PFT 03/01/2014: reported as moderately severe restrictive lung disease with severe perfusion defect . I personally visualized this PFT trace and shows FVC 1.8 L/64%, FEV1 1.4 L/67%, ratio of 78. No broncho-dilator response. Total lung capacity of 3.6 L/70% and DLCO of 8.1/33%. Walking desaturation test 185 feet 3 laps: On room air desaturated to 84% on the second lap."  05/06/2015 PFTs: FEV1 1.52/73%, FVC of 1.87/68%. Total lung capacity of  3.45/68% and DLCO of 8.4/34%.  04/19/15 Chest CT high resolution: IMPRESSION: 1. Interstitial lung disease characterized by peripheral reticulation throughout both lungs sparing the immediate subpleural lung, not appreciably progressed since 07/10/2009, in keeping with nonspecific interstitial pneumonia (NSIP). 2. Tracheobronchomalacia. 3. Mild centrilobular emphysema. 4. Stable moderate elevation of the right hemidiaphragm. 5. Two-vessel coronary atherosclerosis.  05/28/15 CXR: FINDINGS: Elevation of the right hemidiaphragm, stable. No confluent airspace opacities or effusions. Heart is normal size. No acute bony abnormality. Stable moderate compression fracture in the upper lumbar spine. IMPRESSION: No acute findings.  Preoperative labs noted.   Chart will be left for follow-up cardiology records.  George Hugh [facility] Short Stay Center/Anesthesiology Phone [phone] 05/28/2015 3:50 PM  Addendum: Records received from [facility]. She was seen by Neldon Labella, AGNP-C/Dr. Einar Gip last on 03/21/14 for follow-up cardiac testing for dyspnea work-up. Testing showed mild pulmonary hypertension but no evidence of ischemia and PRN cardiology follow-up recommended. I think her EKG appears stable.  03/12/14 Nuclear stress test: Myocardial perfusion imaging is normal. Overall, LV systolic function was normal without regional wall motion abnormalities. LVEF 52%.  03/08/14 Echo: Conclusion: LV cavity is normal in size. Mild concentric LVH. Normal global wall motion. Normal diastolic filling pattern. Calculated EF 55%. Mild MR. Mild TR. Mild pulmonary hypertension. RVSP 36 mmHg.  If no acute changes then I anticipate that she can proceed as planned.  George Hugh [facility] Short Stay Center/Anesthesiology Phone [phone] 05/28/2015 6:47 PM

## 2015-05-29 MED ORDER — DEXTROSE 5 % IV SOLN
1.5000 g | INTRAVENOUS | Status: AC
  Administered 2015-05-30: 1.5 g via INTRAVENOUS
  Filled 2015-05-29: qty 1.5

## 2015-05-29 NOTE — Anesthesia Preprocedure Evaluation (Addendum)
Anesthesia Evaluation  Patient identified by MRN, date of birth, ID band Patient awake    Reviewed: Allergy & Precautions, NPO status , Patient's Chart, lab work & pertinent test results  Airway Mallampati: II  TM Distance: >3 FB Neck ROM: Full    Dental  (+) Edentulous Upper, Edentulous Lower   Pulmonary former smoker,    breath sounds clear to auscultation + decreased breath sounds      Cardiovascular  Rhythm:Regular Rate:Normal     Neuro/Psych    GI/Hepatic   Endo/Other    Renal/GU      Musculoskeletal   Abdominal   Peds  Hematology   Anesthesia Other Findings   Reproductive/Obstetrics                            Anesthesia Physical Anesthesia Plan  ASA: III  Anesthesia Plan: General   Post-op Pain Management:    Induction: Intravenous  Airway Management Planned: Double Lumen EBT  Additional Equipment: Arterial line and CVP  Intra-op Plan:   Post-operative Plan: Extubation in OR  Informed Consent: I have reviewed the patients History and Physical, chart, labs and discussed the procedure including the risks, benefits and alternatives for the proposed anesthesia with the patient or authorized representative who has indicated his/her understanding and acceptance.     Plan Discussed with: CRNA and Anesthesiologist  Anesthesia Plan Comments:         Anesthesia Quick Evaluation

## 2015-05-30 ENCOUNTER — Encounter (HOSPITAL_COMMUNITY)
Admission: RE | Disposition: A | Discharge status: Home / Self Care | Payer: Self-pay | Source: Ambulatory Visit | Attending: Thoracic Surgery (Cardiothoracic Vascular Surgery)

## 2015-05-30 ENCOUNTER — Inpatient Hospital Stay (HOSPITAL_COMMUNITY): Payer: Medicare Other

## 2015-05-30 ENCOUNTER — Inpatient Hospital Stay (HOSPITAL_COMMUNITY)
Admission: RE | Admit: 2015-05-30 | Discharge: 2015-06-02 | DRG: 168 | Disposition: A | Discharge status: Home / Self Care | Payer: Medicare Other | Source: Ambulatory Visit | Attending: Thoracic Surgery (Cardiothoracic Vascular Surgery) | Admitting: Thoracic Surgery (Cardiothoracic Vascular Surgery)

## 2015-05-30 ENCOUNTER — Encounter (HOSPITAL_COMMUNITY): Payer: Self-pay | Admitting: *Deleted

## 2015-05-30 ENCOUNTER — Inpatient Hospital Stay (HOSPITAL_COMMUNITY): Payer: Medicare Other | Admitting: Anesthesiology

## 2015-05-30 ENCOUNTER — Inpatient Hospital Stay (HOSPITAL_COMMUNITY): Payer: Medicare Other | Admitting: Vascular Surgery

## 2015-05-30 DIAGNOSIS — I2510 Atherosclerotic heart disease of native coronary artery without angina pectoris: Secondary | ICD-10-CM | POA: Diagnosis present

## 2015-05-30 DIAGNOSIS — J449 Chronic obstructive pulmonary disease, unspecified: Secondary | ICD-10-CM | POA: Diagnosis not present

## 2015-05-30 DIAGNOSIS — F329 Major depressive disorder, single episode, unspecified: Secondary | ICD-10-CM | POA: Diagnosis present

## 2015-05-30 DIAGNOSIS — J939 Pneumothorax, unspecified: Secondary | ICD-10-CM

## 2015-05-30 DIAGNOSIS — Z87891 Personal history of nicotine dependence: Secondary | ICD-10-CM | POA: Diagnosis not present

## 2015-05-30 DIAGNOSIS — I10 Essential (primary) hypertension: Secondary | ICD-10-CM | POA: Diagnosis present

## 2015-05-30 DIAGNOSIS — R0602 Shortness of breath: Secondary | ICD-10-CM | POA: Diagnosis present

## 2015-05-30 DIAGNOSIS — G8929 Other chronic pain: Secondary | ICD-10-CM | POA: Diagnosis present

## 2015-05-30 DIAGNOSIS — M810 Age-related osteoporosis without current pathological fracture: Secondary | ICD-10-CM | POA: Diagnosis present

## 2015-05-30 DIAGNOSIS — Z4682 Encounter for fitting and adjustment of non-vascular catheter: Secondary | ICD-10-CM | POA: Diagnosis not present

## 2015-05-30 DIAGNOSIS — E785 Hyperlipidemia, unspecified: Secondary | ICD-10-CM | POA: Diagnosis present

## 2015-05-30 DIAGNOSIS — J9811 Atelectasis: Secondary | ICD-10-CM | POA: Diagnosis not present

## 2015-05-30 DIAGNOSIS — Z96641 Presence of right artificial hip joint: Secondary | ICD-10-CM | POA: Diagnosis present

## 2015-05-30 DIAGNOSIS — M549 Dorsalgia, unspecified: Secondary | ICD-10-CM | POA: Diagnosis present

## 2015-05-30 DIAGNOSIS — J849 Interstitial pulmonary disease, unspecified: Secondary | ICD-10-CM | POA: Diagnosis not present

## 2015-05-30 DIAGNOSIS — I272 Other secondary pulmonary hypertension: Secondary | ICD-10-CM | POA: Diagnosis not present

## 2015-05-30 DIAGNOSIS — Z79899 Other long term (current) drug therapy: Secondary | ICD-10-CM | POA: Diagnosis not present

## 2015-05-30 DIAGNOSIS — J842 Lymphoid interstitial pneumonia: Secondary | ICD-10-CM | POA: Diagnosis not present

## 2015-05-30 HISTORY — PX: LUNG BIOPSY: SHX5088

## 2015-05-30 HISTORY — PX: VIDEO ASSISTED THORACOSCOPY: SHX5073

## 2015-05-30 LAB — GLUCOSE, CAPILLARY: GLUCOSE-CAPILLARY: 111 mg/dL — AB (ref 65–99)

## 2015-05-30 SURGERY — VIDEO ASSISTED THORACOSCOPY
Anesthesia: General | Site: Chest | Laterality: Left

## 2015-05-30 MED ORDER — ACETAMINOPHEN 500 MG PO TABS
1000.0000 mg | ORAL_TABLET | Freq: Four times a day (QID) | ORAL | Status: DC
  Administered 2015-05-30 – 2015-05-31 (×2): 1000 mg via ORAL
  Administered 2015-05-31: 500 mg via ORAL
  Administered 2015-05-31 – 2015-06-01 (×3): 1000 mg via ORAL
  Administered 2015-06-01: 500 mg via ORAL
  Administered 2015-06-01 – 2015-06-02 (×2): 1000 mg via ORAL
  Filled 2015-05-30 (×8): qty 2

## 2015-05-30 MED ORDER — BUPIVACAINE HCL (PF) 0.5 % IJ SOLN
INTRAMUSCULAR | Status: DC | PRN
  Administered 2015-05-30: 10 mL

## 2015-05-30 MED ORDER — NEOSTIGMINE METHYLSULFATE 10 MG/10ML IV SOLN
INTRAVENOUS | Status: AC
  Filled 2015-05-30: qty 1

## 2015-05-30 MED ORDER — ONDANSETRON HCL 4 MG/2ML IJ SOLN
INTRAMUSCULAR | Status: AC
  Filled 2015-05-30: qty 2

## 2015-05-30 MED ORDER — ONDANSETRON HCL 4 MG/2ML IJ SOLN
4.0000 mg | Freq: Four times a day (QID) | INTRAMUSCULAR | Status: DC | PRN
Start: 2015-05-30 — End: 2015-06-02

## 2015-05-30 MED ORDER — LACTATED RINGERS IV SOLN
INTRAVENOUS | Status: DC | PRN
  Administered 2015-05-30: 08:00:00 via INTRAVENOUS

## 2015-05-30 MED ORDER — POTASSIUM CHLORIDE 10 MEQ/50ML IV SOLN
10.0000 meq | Freq: Every day | INTRAVENOUS | Status: DC | PRN
  Administered 2015-05-31 (×3): 10 meq via INTRAVENOUS
  Filled 2015-05-30 (×3): qty 50

## 2015-05-30 MED ORDER — UMECLIDINIUM-VILANTEROL 62.5-25 MCG/INH IN AEPB: 1.0000 | INHALATION_SPRAY | Freq: Every day | RESPIRATORY_TRACT | Status: DC

## 2015-05-30 MED ORDER — ONDANSETRON HCL 4 MG/2ML IJ SOLN
INTRAMUSCULAR | Status: DC | PRN
  Administered 2015-05-30: 4 mg via INTRAVENOUS

## 2015-05-30 MED ORDER — ONDANSETRON HCL 4 MG/2ML IJ SOLN: 4.0000 mg | Freq: Four times a day (QID) | INTRAMUSCULAR | Status: DC | PRN

## 2015-05-30 MED ORDER — ACETAMINOPHEN 10 MG/ML IV SOLN
INTRAVENOUS | Status: AC
  Administered 2015-05-30: 1000 mg via INTRAVENOUS
  Filled 2015-05-30: qty 100

## 2015-05-30 MED ORDER — SUCCINYLCHOLINE CHLORIDE 20 MG/ML IJ SOLN
INTRAMUSCULAR | Status: AC
  Filled 2015-05-30: qty 1

## 2015-05-30 MED ORDER — SUGAMMADEX SODIUM 200 MG/2ML IV SOLN
INTRAVENOUS | Status: DC | PRN
  Administered 2015-05-30: 200 mg via INTRAVENOUS

## 2015-05-30 MED ORDER — DEXTROSE 5 % IV SOLN
1.5000 g | Freq: Two times a day (BID) | INTRAVENOUS | Status: AC
  Administered 2015-05-30 – 2015-05-31 (×2): 1.5 g via INTRAVENOUS
  Filled 2015-05-30 (×2): qty 1.5

## 2015-05-30 MED ORDER — DIPHENHYDRAMINE HCL 12.5 MG/5ML PO ELIX: 12.5000 mg | ORAL_SOLUTION | Freq: Four times a day (QID) | ORAL | Status: DC | PRN

## 2015-05-30 MED ORDER — DEXTROSE 5 % IV SOLN
10.0000 mg | INTRAVENOUS | Status: DC | PRN
  Administered 2015-05-30: 20 ug/min via INTRAVENOUS

## 2015-05-30 MED ORDER — SUGAMMADEX SODIUM 500 MG/5ML IV SOLN
INTRAVENOUS | Status: AC
  Filled 2015-05-30: qty 5

## 2015-05-30 MED ORDER — BUPIVACAINE 0.5 % ON-Q PUMP SINGLE CATH 400 ML
400.0000 mL | INJECTION | Status: DC
  Filled 2015-05-30: qty 400

## 2015-05-30 MED ORDER — SENNOSIDES-DOCUSATE SODIUM 8.6-50 MG PO TABS
1.0000 | ORAL_TABLET | Freq: Every day | ORAL | Status: DC
  Administered 2015-05-30 – 2015-06-01 (×3): 1 via ORAL
  Filled 2015-05-30 (×3): qty 1

## 2015-05-30 MED ORDER — HYDROCODONE-ACETAMINOPHEN 5-325 MG PO TABS
1.0000 | ORAL_TABLET | Freq: Four times a day (QID) | ORAL | Status: DC | PRN
  Administered 2015-05-30 – 2015-06-01 (×7): 1 via ORAL
  Filled 2015-05-30 (×7): qty 1

## 2015-05-30 MED ORDER — PANTOPRAZOLE SODIUM 40 MG PO TBEC
80.0000 mg | DELAYED_RELEASE_TABLET | Freq: Every day | ORAL | Status: DC
  Administered 2015-05-31 – 2015-06-02 (×3): 80 mg via ORAL
  Filled 2015-05-30 (×3): qty 2

## 2015-05-30 MED ORDER — OXYCODONE HCL 5 MG/5ML PO SOLN: 5.0000 mg | Freq: Once | ORAL | Status: DC | PRN

## 2015-05-30 MED ORDER — SUGAMMADEX SODIUM 200 MG/2ML IV SOLN
INTRAVENOUS | Status: AC
  Filled 2015-05-30: qty 2

## 2015-05-30 MED ORDER — SODIUM CHLORIDE 0.9% FLUSH
9.0000 mL | INTRAVENOUS | Status: DC | PRN
  Administered 2015-05-30: 9 mL via INTRAVENOUS
  Filled 2015-05-30: qty 9

## 2015-05-30 MED ORDER — GLYCOPYRROLATE 0.2 MG/ML IJ SOLN
INTRAMUSCULAR | Status: AC
  Filled 2015-05-30: qty 3

## 2015-05-30 MED ORDER — OXYCODONE HCL 5 MG PO TABS
ORAL_TABLET | ORAL | Status: AC
  Administered 2015-05-30: 5 mg
  Filled 2015-05-30: qty 1

## 2015-05-30 MED ORDER — ACETAMINOPHEN 10 MG/ML IV SOLN
1000.0000 mg | Freq: Four times a day (QID) | INTRAVENOUS | Status: DC
  Administered 2015-05-30: 1000 mg via INTRAVENOUS

## 2015-05-30 MED ORDER — OXYCODONE HCL 5 MG PO TABS: 5.0000 mg | ORAL_TABLET | Freq: Once | ORAL | Status: DC | PRN

## 2015-05-30 MED ORDER — BUPIVACAINE 0.5 % ON-Q PUMP SINGLE CATH 400 ML
INJECTION | Status: DC | PRN
  Administered 2015-05-30: 400 mL

## 2015-05-30 MED ORDER — LORATADINE 10 MG PO TABS
10.0000 mg | ORAL_TABLET | Freq: Every day | ORAL | Status: DC
  Administered 2015-05-30 – 2015-06-02 (×4): 10 mg via ORAL
  Filled 2015-05-30 (×4): qty 1

## 2015-05-30 MED ORDER — ONDANSETRON HCL 4 MG/2ML IJ SOLN
4.0000 mg | Freq: Once | INTRAMUSCULAR | Status: AC | PRN
  Administered 2015-05-30: 4 mg via INTRAVENOUS

## 2015-05-30 MED ORDER — FENTANYL CITRATE (PF) 100 MCG/2ML IJ SOLN
INTRAMUSCULAR | Status: DC | PRN
  Administered 2015-05-30 (×3): 50 ug via INTRAVENOUS
  Administered 2015-05-30: 100 ug via INTRAVENOUS

## 2015-05-30 MED ORDER — ARFORMOTEROL TARTRATE 15 MCG/2ML IN NEBU
15.0000 ug | INHALATION_SOLUTION | Freq: Two times a day (BID) | RESPIRATORY_TRACT | Status: DC
  Administered 2015-05-31 – 2015-06-02 (×4): 15 ug via RESPIRATORY_TRACT
  Filled 2015-05-30 (×6): qty 2

## 2015-05-30 MED ORDER — FENTANYL CITRATE (PF) 250 MCG/5ML IJ SOLN
INTRAMUSCULAR | Status: AC
  Filled 2015-05-30: qty 5

## 2015-05-30 MED ORDER — UMECLIDINIUM BROMIDE 62.5 MCG/INH IN AEPB
1.0000 | INHALATION_SPRAY | Freq: Every day | RESPIRATORY_TRACT | Status: DC
  Administered 2015-05-31 – 2015-06-02 (×3): 1 via RESPIRATORY_TRACT
  Filled 2015-05-30: qty 7

## 2015-05-30 MED ORDER — EPHEDRINE SULFATE 50 MG/ML IJ SOLN
INTRAMUSCULAR | Status: AC
  Filled 2015-05-30: qty 1

## 2015-05-30 MED ORDER — BUPIVACAINE ON-Q PAIN PUMP (FOR ORDER SET NO CHG)
INJECTION | Status: AC
Start: 2015-05-30 — End: 2015-06-02
  Filled 2015-05-30: qty 1

## 2015-05-30 MED ORDER — ACETAMINOPHEN 160 MG/5ML PO SOLN
1000.0000 mg | Freq: Four times a day (QID) | ORAL | Status: DC
  Administered 2015-05-30: 1000 mg via ORAL
  Filled 2015-05-30: qty 40.6

## 2015-05-30 MED ORDER — INSULIN ASPART 100 UNIT/ML ~~LOC~~ SOLN
0.0000 [IU] | Freq: Four times a day (QID) | SUBCUTANEOUS | Status: DC
  Administered 2015-05-31: 2 [IU] via SUBCUTANEOUS

## 2015-05-30 MED ORDER — NALOXONE HCL 0.4 MG/ML IJ SOLN: 0.4000 mg | INTRAMUSCULAR | Status: DC | PRN

## 2015-05-30 MED ORDER — TRAMADOL HCL 50 MG PO TABS
50.0000 mg | ORAL_TABLET | Freq: Four times a day (QID) | ORAL | Status: DC | PRN
  Administered 2015-05-31 – 2015-06-01 (×3): 100 mg via ORAL
  Filled 2015-05-30 (×3): qty 2

## 2015-05-30 MED ORDER — DIAZEPAM 5 MG PO TABS
5.0000 mg | ORAL_TABLET | Freq: Four times a day (QID) | ORAL | Status: DC | PRN
  Administered 2015-05-30 – 2015-06-01 (×4): 5 mg via ORAL
  Filled 2015-05-30 (×4): qty 1

## 2015-05-30 MED ORDER — FENTANYL 40 MCG/ML IV SOLN
INTRAVENOUS | Status: AC
  Filled 2015-05-30: qty 25

## 2015-05-30 MED ORDER — MIDAZOLAM HCL 2 MG/2ML IJ SOLN
INTRAMUSCULAR | Status: AC
  Filled 2015-05-30: qty 2

## 2015-05-30 MED ORDER — FENTANYL 40 MCG/ML IV SOLN
INTRAVENOUS | Status: DC
  Administered 2015-05-30: 70 ug via INTRAVENOUS
  Administered 2015-05-30: 11:00:00 via INTRAVENOUS
  Administered 2015-05-30: 40 ug via INTRAVENOUS
  Administered 2015-05-31: 180 ug via INTRAVENOUS
  Administered 2015-05-31: 190 ug via INTRAVENOUS
  Administered 2015-05-31: 80 ug via INTRAVENOUS
  Administered 2015-05-31: 210 ug via INTRAVENOUS
  Administered 2015-05-31: 60 ug via INTRAVENOUS
  Administered 2015-06-01: 70 ug via INTRAVENOUS
  Administered 2015-06-01: 100 ug via INTRAVENOUS
  Administered 2015-06-01: 90 ug via INTRAVENOUS
  Administered 2015-06-02: 20 ug via INTRAVENOUS
  Administered 2015-06-02: 70 ug via INTRAVENOUS
  Filled 2015-05-30: qty 25

## 2015-05-30 MED ORDER — BISACODYL 5 MG PO TBEC
10.0000 mg | DELAYED_RELEASE_TABLET | Freq: Every day | ORAL | Status: DC
  Administered 2015-05-30 – 2015-05-31 (×2): 10 mg via ORAL
  Filled 2015-05-30 (×2): qty 2

## 2015-05-30 MED ORDER — PHENYLEPHRINE 40 MCG/ML (10ML) SYRINGE FOR IV PUSH (FOR BLOOD PRESSURE SUPPORT)
PREFILLED_SYRINGE | INTRAVENOUS | Status: AC
  Filled 2015-05-30: qty 10

## 2015-05-30 MED ORDER — FENTANYL CITRATE (PF) 100 MCG/2ML IJ SOLN
INTRAMUSCULAR | Status: AC
  Administered 2015-05-30: 50 ug via INTRAVENOUS
  Filled 2015-05-30: qty 2

## 2015-05-30 MED ORDER — DIPHENHYDRAMINE HCL 50 MG/ML IJ SOLN: 12.5000 mg | Freq: Four times a day (QID) | INTRAMUSCULAR | Status: DC | PRN

## 2015-05-30 MED ORDER — 0.9 % SODIUM CHLORIDE (POUR BTL) OPTIME
TOPICAL | Status: DC | PRN
  Administered 2015-05-30: 2000 mL

## 2015-05-30 MED ORDER — KCL IN DEXTROSE-NACL 20-5-0.45 MEQ/L-%-% IV SOLN
INTRAVENOUS | Status: DC
  Administered 2015-05-30 – 2015-05-31 (×3): via INTRAVENOUS
  Filled 2015-05-30 (×4): qty 1000

## 2015-05-30 MED ORDER — SIMVASTATIN 40 MG PO TABS
40.0000 mg | ORAL_TABLET | Freq: Every day | ORAL | Status: DC
  Administered 2015-05-30 – 2015-06-01 (×3): 40 mg via ORAL
  Filled 2015-05-30 (×3): qty 1

## 2015-05-30 MED ORDER — FENTANYL CITRATE (PF) 100 MCG/2ML IJ SOLN
25.0000 ug | INTRAMUSCULAR | Status: DC | PRN
  Administered 2015-05-30 (×3): 50 ug via INTRAVENOUS

## 2015-05-30 MED ORDER — LIDOCAINE HCL (CARDIAC) 20 MG/ML IV SOLN
INTRAVENOUS | Status: AC
  Filled 2015-05-30: qty 5

## 2015-05-30 MED ORDER — STERILE WATER FOR INJECTION IJ SOLN
INTRAMUSCULAR | Status: AC
  Filled 2015-05-30: qty 10

## 2015-05-30 MED ORDER — PROPOFOL 10 MG/ML IV BOLUS
INTRAVENOUS | Status: AC
  Filled 2015-05-30: qty 20

## 2015-05-30 MED ORDER — PROPOFOL 10 MG/ML IV BOLUS
INTRAVENOUS | Status: DC | PRN
  Administered 2015-05-30: 120 mg via INTRAVENOUS

## 2015-05-30 MED ORDER — LIDOCAINE HCL (CARDIAC) 20 MG/ML IV SOLN
INTRAVENOUS | Status: DC | PRN
  Administered 2015-05-30: 60 mg via INTRAVENOUS

## 2015-05-30 MED ORDER — BUPIVACAINE HCL (PF) 0.5 % IJ SOLN
INTRAMUSCULAR | Status: AC
  Filled 2015-05-30: qty 10

## 2015-05-30 MED ORDER — ROCURONIUM BROMIDE 50 MG/5ML IV SOLN
INTRAVENOUS | Status: AC
  Filled 2015-05-30: qty 1

## 2015-05-30 MED ORDER — ROCURONIUM BROMIDE 100 MG/10ML IV SOLN
INTRAVENOUS | Status: DC | PRN
  Administered 2015-05-30: 50 mg via INTRAVENOUS
  Administered 2015-05-30: 5 mg via INTRAVENOUS
  Administered 2015-05-30: 10 mg via INTRAVENOUS
  Administered 2015-05-30: 20 mg via INTRAVENOUS

## 2015-05-30 SURGICAL SUPPLY — 100 items
ADH SKN CLS APL DERMABOND .7 (GAUZE/BANDAGES/DRESSINGS) ×2
APL SKNCLS STERI-STRIP NONHPOA (GAUZE/BANDAGES/DRESSINGS) ×2
APPLIER CLIP ROT 10 11.4 M/L (STAPLE)
APR CLP MED LRG 11.4X10 (STAPLE)
BAG SPEC RTRVL LRG 6X4 10 (ENDOMECHANICALS)
BALL CTTN LRG ABS STRL LF (GAUZE/BANDAGES/DRESSINGS)
BENZOIN TINCTURE PRP APPL 2/3 (GAUZE/BANDAGES/DRESSINGS) ×3 IMPLANT
BLOCK BITE 60FR ADLT L/F BLUE (MISCELLANEOUS) ×1 IMPLANT
BRUSH CYTOL CELLEBRITY 1.5X140 (MISCELLANEOUS) IMPLANT
CANISTER SUCTION 2500CC (MISCELLANEOUS) ×4 IMPLANT
CATH KIT ON Q 5IN SLV (PAIN MANAGEMENT) IMPLANT
CATH THORACIC 28FR (CATHETERS) ×3 IMPLANT
CATH THORACIC 28FR RT ANG (CATHETERS) IMPLANT
CATH THORACIC 36FR (CATHETERS) IMPLANT
CATH THORACIC 36FR RT ANG (CATHETERS) IMPLANT
CLIP APPLIE ROT 10 11.4 M/L (STAPLE) IMPLANT
CLIP TI MEDIUM 6 (CLIP) IMPLANT
CLOSURE STERI-STRIP 1/2X4 (GAUZE/BANDAGES/DRESSINGS) ×1
CLSR STERI-STRIP ANTIMIC 1/2X4 (GAUZE/BANDAGES/DRESSINGS) ×2 IMPLANT
CONN Y 3/8X3/8X3/8  BEN (MISCELLANEOUS)
CONN Y 3/8X3/8X3/8 BEN (MISCELLANEOUS) ×1 IMPLANT
CONT SPEC 4OZ CLIKSEAL STRL BL (MISCELLANEOUS) ×17 IMPLANT
COTTONBALL LRG STERILE PKG (GAUZE/BANDAGES/DRESSINGS) IMPLANT
COVER SURGICAL LIGHT HANDLE (MISCELLANEOUS) ×4 IMPLANT
COVER TABLE BACK 60X90 (DRAPES) ×1 IMPLANT
DERMABOND ADVANCED (GAUZE/BANDAGES/DRESSINGS) ×2
DERMABOND ADVANCED .7 DNX12 (GAUZE/BANDAGES/DRESSINGS) ×1 IMPLANT
DRAIN CHANNEL 28F RND 3/8 FF (WOUND CARE) IMPLANT
DRAIN CHANNEL 32F RND 10.7 FF (WOUND CARE) IMPLANT
DRAPE LAPAROSCOPIC ABDOMINAL (DRAPES) ×4 IMPLANT
DRAPE WARM FLUID 44X44 (DRAPE) ×4 IMPLANT
DRSG TEGADERM 4X4.75 (GAUZE/BANDAGES/DRESSINGS) ×3 IMPLANT
ELECT REM PT RETURN 9FT ADLT (ELECTROSURGICAL) ×4
ELECTRODE REM PT RTRN 9FT ADLT (ELECTROSURGICAL) ×2 IMPLANT
FILTER STRAW FLUID ASPIR (MISCELLANEOUS) IMPLANT
FORCEPS BIOP RJ4 1.8 (CUTTING FORCEPS) IMPLANT
GAUZE SPONGE 4X4 12PLY STRL (GAUZE/BANDAGES/DRESSINGS) ×4 IMPLANT
GLOVE SURG SIGNA 7.5 PF LTX (GLOVE) ×8 IMPLANT
GOWN STRL REUS W/ TWL LRG LVL3 (GOWN DISPOSABLE) ×4 IMPLANT
GOWN STRL REUS W/ TWL XL LVL3 (GOWN DISPOSABLE) ×2 IMPLANT
GOWN STRL REUS W/TWL LRG LVL3 (GOWN DISPOSABLE) ×8
GOWN STRL REUS W/TWL XL LVL3 (GOWN DISPOSABLE) ×4
HEMOSTAT SURGICEL 2X14 (HEMOSTASIS) IMPLANT
KIT BASIN OR (CUSTOM PROCEDURE TRAY) ×4 IMPLANT
KIT CLEAN ENDO COMPLIANCE (KITS) ×1 IMPLANT
KIT ROOM TURNOVER OR (KITS) ×4 IMPLANT
KIT SUCTION CATH 14FR (SUCTIONS) ×1 IMPLANT
NDL BIOPSY TRANSBRONCH 21G (NEEDLE) IMPLANT
NEEDLE 22X1 1/2 (OR ONLY) (NEEDLE) IMPLANT
NEEDLE BIOPSY TRANSBRONCH 21G (NEEDLE) IMPLANT
NS IRRIG 1000ML POUR BTL (IV SOLUTION) ×8 IMPLANT
PACK CHEST (CUSTOM PROCEDURE TRAY) ×4 IMPLANT
PAD ARMBOARD 7.5X6 YLW CONV (MISCELLANEOUS) ×8 IMPLANT
POUCH ENDO CATCH II 15MM (MISCELLANEOUS) IMPLANT
POUCH SPECIMEN RETRIEVAL 10MM (ENDOMECHANICALS) IMPLANT
RELOAD STAPLE 60 3.8 GOLD REG (STAPLE) IMPLANT
RELOAD STAPLER GOLD 60MM (STAPLE) ×20 IMPLANT
SEALANT PROGEL (MISCELLANEOUS) IMPLANT
SEALANT SURG COSEAL 4ML (VASCULAR PRODUCTS) IMPLANT
SEALANT SURG COSEAL 8ML (VASCULAR PRODUCTS) IMPLANT
SOLUTION ANTI FOG 6CC (MISCELLANEOUS) ×1 IMPLANT
SPECIMEN JAR MEDIUM (MISCELLANEOUS) ×1 IMPLANT
SPONGE GAUZE 4X4 12PLY STER LF (GAUZE/BANDAGES/DRESSINGS) ×3 IMPLANT
SPONGE INTESTINAL PEANUT (DISPOSABLE) IMPLANT
SPONGE TONSIL 1 RF SGL (DISPOSABLE) ×1 IMPLANT
STAPLE ECHEON FLEX 60 POW ENDO (STAPLE) ×3 IMPLANT
STAPLER RELOAD GOLD 60MM (STAPLE) ×40
SUT PROLENE 4 0 RB 1 (SUTURE)
SUT PROLENE 4-0 RB1 .5 CRCL 36 (SUTURE) IMPLANT
SUT SILK  1 MH (SUTURE) ×4
SUT SILK 1 MH (SUTURE) ×4 IMPLANT
SUT SILK 2 0SH CR/8 30 (SUTURE) IMPLANT
SUT SILK 3 0SH CR/8 30 (SUTURE) ×3 IMPLANT
SUT VIC AB 1 CTX 36 (SUTURE) ×4
SUT VIC AB 1 CTX36XBRD ANBCTR (SUTURE) ×1 IMPLANT
SUT VIC AB 2-0 CTX 36 (SUTURE) ×3 IMPLANT
SUT VIC AB 2-0 UR6 27 (SUTURE) IMPLANT
SUT VIC AB 3-0 MH 27 (SUTURE) IMPLANT
SUT VIC AB 3-0 X1 27 (SUTURE) ×4 IMPLANT
SUT VICRYL 2 TP 1 (SUTURE) IMPLANT
SWAB COLLECTION DEVICE MRSA (MISCELLANEOUS) IMPLANT
SYR 20ML ECCENTRIC (SYRINGE) ×4 IMPLANT
SYR 5ML LL (SYRINGE) ×1 IMPLANT
SYR 5ML LUER SLIP (SYRINGE) ×1 IMPLANT
SYR CONTROL 10ML LL (SYRINGE) IMPLANT
SYSTEM SAHARA CHEST DRAIN ATS (WOUND CARE) ×4 IMPLANT
TAPE CLOTH 4X10 WHT NS (GAUZE/BANDAGES/DRESSINGS) ×4 IMPLANT
TAPE CLOTH SURG 4X10 WHT LF (GAUZE/BANDAGES/DRESSINGS) ×3 IMPLANT
TIP APPLICATOR SPRAY EXTEND 16 (VASCULAR PRODUCTS) IMPLANT
TOWEL OR 17X24 6PK STRL BLUE (TOWEL DISPOSABLE) ×4 IMPLANT
TOWEL OR 17X26 10 PK STRL BLUE (TOWEL DISPOSABLE) ×8 IMPLANT
TRAP SPECIMEN MUCOUS 40CC (MISCELLANEOUS) ×2 IMPLANT
TRAY FOLEY CATH 16FRSI W/METER (SET/KITS/TRAYS/PACK) ×4 IMPLANT
TROCAR XCEL BLADELESS 5X75MML (TROCAR) ×4 IMPLANT
TROCAR XCEL NON-BLD 5MMX100MML (ENDOMECHANICALS) IMPLANT
TUBE ANAEROBIC SPECIMEN COL (MISCELLANEOUS) IMPLANT
TUBE CONNECTING 20'X1/4 (TUBING) ×1
TUBE CONNECTING 20X1/4 (TUBING) ×3 IMPLANT
TUNNELER SHEATH ON-Q 11GX8 DSP (PAIN MANAGEMENT) ×3 IMPLANT
WATER STERILE IRR 1000ML POUR (IV SOLUTION) ×5 IMPLANT

## 2015-05-30 NOTE — Brief Op Note (Addendum)
05/30/2015  9:55 AM  PATIENT:  Diane Howell  75 y.o. female  PRE-OPERATIVE DIAGNOSIS:  Interstitial Lung Disease  POST-OPERATIVE DIAGNOSIS:  Interstitial Lung Disease  PROCEDURE:  LEFT VATS, LUNG BIOPSIES of LLL x 2, LUL, LINGULA  SURGEON:  Surgeon(s) and Role:    * Melrose Nakayama, MD - Primary  PHYSICIAN ASSISTANT: Lars Pinks PA-C  ANESTHESIA:   general  EBL:  Total I/O In: 1000 [I.V.:1000] Out: 500 [Urine:450; Blood:50]  BLOOD ADMINISTERED:none  DRAINS: 48 French chest tube in the left pleural space   LOCAL MEDICATIONS USED:  BUPIVICAINE   SPECIMEN:  Source of Specimen:  LLL x 2, LUL, Left lingula  DISPOSITION OF SPECIMEN:  Pathology. Frozen of LLL showed fribrous lung tissue with chronic inflammation  COUNTS CORRECT:  YES  DICTATION: .Dragon Dictation  PLAN OF CARE: Admit to inpatient   PATIENT DISPOSITION:  PACU - hemodynamically stable.   Delay start of Pharmacological VTE agent (>24hrs) due to surgical blood loss or risk of bleeding: yes  Frozen- pulmonary fibrosis and ILD

## 2015-05-30 NOTE — H&P (View-Only) (Signed)
PCP is Jani Gravel, MD Referring Provider is Brand Males, MD  Chief Complaint  Patient presents with  . Shortness of Breath    ILD...EVAL FOR BX.Marland KitchenMarland KitchenCT CHEST HI RESOLUTION.Marland KitchenMarland Kitchen2/17/17    HPI: Diane Howell is a 75 year old Diane Howell who was sent for consideration of lung biopsy for interstitial lung disease.  Diane Howell is a 75 year old Diane Howell with past medical history significant for eventration of the right hemidiaphragm, hyperlipidemia, gastroesophageal reflux, osteoporosis, fracture of sacrum, osteoarthritis, and chronic back pain. Diane Howell has a remote history of tobacco abuse and smoked 1 pack a day for 33 years before quitting in 1988.   Diane Howell had an episode of severe bronchitis in November 2015. Diane Howell remembers having a cough for a long time after that but it ultimately resolved. Last fall Diane Howell began to notice fatigue and exertional shortness of breath when walking or doing chores around the house. About 2 months ago Diane Howell cough returned. This was initially a dry cough but then became productive. Diane Howell has not had any hemoptysis.   Dr. Maudie Mercury started Diane Howell on [facility] a few weeks ago. It helped Diane Howell shortness of breath and fatigue but it did not resolve Diane Howell cough. Diane Howell still has shortness of breath with exertion, but fatigue is more of a problem to Diane Howell presently. Diane Howell has oxygen at home, but does not use it all the time. Diane Howell has chronic back pain.  Zubrod Score: At the time of surgery this patient's most appropriate activity status/level should be described as: []     0    Normal activity, no symptoms [x]     1    Restricted in physical strenuous activity but ambulatory, able to do out light work []     2    Ambulatory and capable of self care, unable to do work activities, up and about >50 % of waking hours                              []     3    Only limited self care, in bed greater than 50% of waking hours []     4    Completely disabled, no self care, confined to bed or chair []     5    Moribund  Past Medical  History  Diagnosis Date  . Hyperlipidemia   . GERD (gastroesophageal reflux disease)   . Diverticulosis   . Diaphragm, eventration right  . Hematuria   . Fracture of sacrum ([place])   . Chronic back pain   . OA (osteoarthritis)     Past Surgical History  Procedure Laterality Date  . Breast reduction surgery    . Rotator cuff repair Right   . Total abdominal hysterectomy    . Dilation and curettage of uterus      Family History  Problem Relation Age of Onset  . Heart attack Father   . Heart failure Mother   . COPD Father   . Diabetes Mellitus II Father   . Breast cancer Mother   . Breast cancer Sister     Social History Social History  Substance Use Topics  . Smoking status: Former Smoker -- 1.00 packs/day for 33 years    Types: Cigarettes    Quit date: 07/04/1986  . Smokeless tobacco: Never Used  . Alcohol Use: 0.0 oz/week    0 Standard drinks or equivalent per week     Comment: occasional    Current Outpatient Prescriptions  Medication Sig  Dispense Refill  . acetaminophen (TYLENOL) 325 MG tablet Take 650 mg by mouth every 6 (six) hours as needed.    . benzonatate (TESSALON) 200 MG capsule Take 1 capsule (200 mg total) by mouth 3 (three) times daily as needed for cough. 30 capsule 1  . butalbital-aspirin-caffeine (FIORINAL) 50-325-40 MG capsule Take 1 capsule by mouth 2 (two) times daily as needed for headache.    . dexlansoprazole (DEXILANT) 60 MG capsule Take 60 mg by mouth daily.    . diazepam (VALIUM) 5 MG tablet Take 5 mg by mouth every 6 (six) hours as needed for anxiety.    . fexofenadine (ALLEGRA) 180 MG tablet Take 180 mg by mouth daily.    Marland Kitchen HYDROcodone-acetaminophen (NORCO/VICODIN) 5-325 MG tablet Take 1 tablet by mouth every 6 (six) hours as needed for moderate pain.    . Misc Natural Products (OSTEO BI-FLEX ADV DOUBLE ST PO) Take by mouth daily.    . sertraline (ZOLOFT) 25 MG tablet Take 25 mg by mouth daily.    . simvastatin (ZOCOR) 40 MG tablet Take 40  mg by mouth daily.    Marland Kitchen umeclidinium-vilanterol (ANORO ELLIPTA) 62.5-25 MCG/INH AEPB Inhale 1 puff into the lungs daily.     No current facility-administered medications for this visit.    Not on File  Review of Systems  Constitutional: Positive for appetite change and fatigue. Negative for unexpected weight change.  HENT: Positive for dental problem (Dentures). Negative for trouble swallowing and voice change.   Respiratory: Positive for cough, shortness of breath and wheezing. Negative for chest tightness.   Cardiovascular: Negative for chest pain, palpitations and leg swelling.  Gastrointestinal: Negative for abdominal pain and blood in stool.  Genitourinary: Negative for hematuria and difficulty urinating.  Musculoskeletal: Positive for back pain and arthralgias.  Neurological: Positive for headaches. Negative for syncope and weakness.  Hematological: Negative for adenopathy. Does not bruise/bleed easily.  Psychiatric/Behavioral: Positive for dysphoric mood. The patient is nervous/anxious.   All other systems reviewed and are negative.   BP 144/72 mmHg  Pulse 86  Resp 16  Ht 5\' 4"  (1.626 m)  Wt 152 lb (68.947 kg)  BMI 26.08 kg/m2  SpO2 95% Physical Exam  Constitutional: Diane Howell is oriented to person, place, and time. Diane Howell appears well-developed and well-nourished.  HENT:  Head: Normocephalic and atraumatic.  Mouth/Throat: No oropharyngeal exudate.  Eyes: Conjunctivae and EOM are normal. No scleral icterus.  Neck: Neck supple. No thyromegaly present.  Cardiovascular: Normal rate, regular rhythm, normal heart sounds and intact distal pulses.   No murmur heard. Pulmonary/Chest: Effort normal. No respiratory distress. Diane Howell has no wheezes. Diane Howell has no rales.  Diminished breath sounds right base  Abdominal: Soft. Diane Howell exhibits no distension. There is no tenderness.  Musculoskeletal: Diane Howell exhibits no edema.  Lymphadenopathy:    Diane Howell has no cervical adenopathy.  Neurological: Diane Howell is  alert and oriented to person, place, and time. No cranial nerve deficit.  No focal motor deficit  Skin: Skin is warm and dry.  Vitals reviewed.    Diagnostic Tests:  CT CHEST WITHOUT CONTRAST  TECHNIQUE: Multidetector CT imaging of the chest was performed following the standard protocol without intravenous contrast. High resolution imaging of the lungs, as well as inspiratory and expiratory imaging, was performed.  COMPARISON: 03/28/2015 chest radiograph. 07/10/2009 chest CT.  FINDINGS: Mediastinum/Nodes: Normal heart size. No pericardial fluid/thickening. Left anterior descending and right coronary atherosclerosis. Great vessels are normal in course and caliber. Normal visualized thyroid. Normal esophagus. No pathologically  enlarged axillary, mediastinal or gross hilar lymph nodes, noting limited sensitivity for the detection of hilar adenopathy on this noncontrast study.  Lungs/Pleura: No pneumothorax. No pleural effusion. Right middle lobe 3 mm pulmonary nodule (series 5/ image 32) is stable since 07/10/2009 and benign. No acute consolidative airspace disease, new significant pulmonary nodules or lung masses. Mild centrilobular emphysema. There is patchy peripheral reticulation throughout both lungs, without a clear basilar gradient, which spares the immediate subpleural lung. No significant regions of traction bronchiectasis, ground-glass attenuation, architectural distortion, parenchymal banding or frank honeycombing. Findings have not significantly progressed since 07/10/2009. No evidence of significant air trapping on the expiration sequence. There is tracheobronchomalacia on the expiration sequence, with near complete collapse of the tracheal lumen on the expiration sequence (AP tracheal luminal diameter 2.3 cm on the inspiration sequence and 0.2 cm on the expiration sequence.  Upper abdomen: Simple 1.2 cm peripheral right liver lobe cyst. Simple 1.0 cm  medial segment left liver lobe cyst. Lateral segment left liver lobe 2.5 cm cyst with mural calcification, significantly decreased in size from 5.9 cm on 07/10/2009, in keeping with a benign cyst. Stable moderate elevation of the right hemidiaphragm.  Musculoskeletal: No aggressive appearing focal osseous lesions. Mild degenerative changes in the thoracic spine.  IMPRESSION: 1. Interstitial lung disease characterized by peripheral reticulation throughout both lungs sparing the immediate subpleural lung, not appreciably progressed since 07/10/2009, in keeping with nonspecific interstitial pneumonia (NSIP). 2. Tracheobronchomalacia. 3. Mild centrilobular emphysema. 4. Stable moderate elevation of the right hemidiaphragm. 5. Two-vessel coronary atherosclerosis.   Electronically Signed  By: Ilona Sorrel M.D.  On: 04/20/2015 11:12  I personally reviewed the CT chest and concur with the findings noted above.  Impression: 75 year old Diane Howell with interstitial lung disease of unknown etiology. Diane Howell needs a lung biopsy for diagnostic purposes.  I discussed the proposed procedure of left VATS, lung biopsy, possible bronchoscopy with Mrs. Misquez. I informed Diane Howell of the need for general anesthesia, the incisions to be used, use of drainage tubes postoperatively, the expected hospital stay and the overall recovery. Diane Howell understands this is a diagnostic procedure not therapeutic. I informed Diane Howell the indications, risks, benefits, and alternatives. Diane Howell understands the risks include, but are not limited to death, MI, DVT, PE, bleeding, possible need for transfusion, infection, prolonged air leak, cardiac arrhythmias, as well as the possibility of other unforeseeable complications.  Diane Howell does have evidence of coronary disease on CT, but has no anginal symptoms. I do not think any further workup is necessary for that preoperatively.  Diane Howell accepts the risks and wishes to proceed.  Plan: Left VATS, lung  biopsy, possible bronchoscopy (should Diane Howell opt to participate in the clinical trial)  Melrose Nakayama, MD [facility] [phone]

## 2015-05-30 NOTE — Anesthesia Procedure Notes (Signed)
Procedure Name: Intubation Date/Time: 05/30/2015 8:20 AM Performed by: Shirlyn Goltz Pre-anesthesia Checklist: Patient identified, Emergency Drugs available, Suction available and Patient being monitored Patient Re-evaluated:Patient Re-evaluated prior to inductionOxygen Delivery Method: Circle system utilized Preoxygenation: Pre-oxygenation with 100% oxygen Intubation Type: IV induction Ventilation: Mask ventilation without difficulty and Oral airway inserted - appropriate to patient size Laryngoscope Size: Mac and 3 Grade View: Grade I Endobronchial tube: Left, EBT position confirmed by fiberoptic bronchoscope and EBT position confirmed by auscultation and 35 Fr Number of attempts: 1 Airway Equipment and Method: Stylet Placement Confirmation: ETT inserted through vocal cords under direct vision,  positive ETCO2 and breath sounds checked- equal and bilateral Tube secured with: Tape Dental Injury: Teeth and Oropharynx as per pre-operative assessment

## 2015-05-30 NOTE — Transfer of Care (Signed)
Immediate Anesthesia Transfer of Care Note  Patient: Diane Howell  Procedure(s) Performed: Procedure(s): LEFT VIDEO ASSISTED THORACOSCOPY WITH BIOPSY (Left) LEFT LUNG BIOPSY (Left)  Patient Location: PACU  Anesthesia Type:General  Level of Consciousness: awake, alert , oriented and patient cooperative  Airway & Oxygen Therapy: Patient Spontanous Breathing and Patient connected to nasal cannula oxygen  Post-op Assessment: Report given to RN and Post -op Vital signs reviewed and stable  Post vital signs: Reviewed and stable  Last Vitals:  Filed Vitals:   05/30/15 0623  BP: 123/50  Pulse: 75  Temp: 36.6 C  Resp: 18    Complications: No apparent anesthesia complications

## 2015-05-30 NOTE — Anesthesia Postprocedure Evaluation (Signed)
Anesthesia Post Note  Patient: Diane Howell  Procedure(s) Performed: Procedure(s) (LRB): LEFT VIDEO ASSISTED THORACOSCOPY WITH BIOPSY (Left) LEFT LUNG BIOPSY (Left)  Patient location during evaluation: PACU Anesthesia Type: General Level of consciousness: awake, awake and alert and oriented Pain management: pain level controlled Vital Signs Assessment: post-procedure vital signs reviewed and stable Respiratory status: spontaneous breathing, nonlabored ventilation, respiratory function stable and patient connected to nasal cannula oxygen Anesthetic complications: no    Last Vitals:  Filed Vitals:   05/30/15 1628 05/30/15 1832  BP: 126/58 119/104  Pulse:  95  Temp: 37.1 C   Resp:  16    Last Pain:  Filed Vitals:   05/30/15 1837  PainSc: 0-No pain                 [name] COKER

## 2015-05-30 NOTE — Interval H&P Note (Signed)
History and Physical Interval Note:  05/30/2015 8:02 AM  Diane Howell  has presented today for surgery, with the diagnosis of ILD  The various methods of treatment have been discussed with the patient and family. After consideration of risks, benefits and other options for treatment, the patient has consented to  Procedure(s): VIDEO ASSISTED THORACOSCOPY (Left) LUNG BIOPSY (Left) POSSIBLE VIDEO BRONCHOSCOPY (N/A) as a surgical intervention .  The patient's history has been reviewed, patient examined, no change in status, stable for surgery.  I have reviewed the patient's chart and labs.  Questions were answered to the patient's satisfaction.     Melrose Nakayama

## 2015-05-31 ENCOUNTER — Inpatient Hospital Stay (HOSPITAL_COMMUNITY): Payer: Medicare Other

## 2015-05-31 ENCOUNTER — Encounter (HOSPITAL_COMMUNITY): Payer: Self-pay | Admitting: Thoracic Surgery (Cardiothoracic Vascular Surgery)

## 2015-05-31 LAB — CBC
HCT: 36.2 % (ref 36.0–46.0)
Hemoglobin: 11.7 g/dL — ABNORMAL LOW (ref 12.0–15.0)
MCH: 30.1 pg (ref 26.0–34.0)
MCHC: 32.3 g/dL (ref 30.0–36.0)
MCV: 93.1 fL (ref 78.0–100.0)
PLATELETS: 232 10*3/uL (ref 150–400)
RBC: 3.89 MIL/uL (ref 3.87–5.11)
RDW: 13.1 % (ref 11.5–15.5)
WBC: 15.8 10*3/uL — ABNORMAL HIGH (ref 4.0–10.5)

## 2015-05-31 LAB — BASIC METABOLIC PANEL
ANION GAP: 8 (ref 5–15)
BUN: <5 mg/dL — ABNORMAL LOW (ref 6–20)
CHLORIDE: 105 mmol/L (ref 101–111)
CO2: 27 mmol/L (ref 22–32)
Calcium: 8.5 mg/dL — ABNORMAL LOW (ref 8.9–10.3)
Creatinine, Ser: 0.59 mg/dL (ref 0.44–1.00)
GFR calc non Af Amer: >60 mL/min (ref >60)
GLUCOSE: 116 mg/dL — AB (ref 65–99)
Potassium: 3.4 mmol/L — ABNORMAL LOW (ref 3.5–5.1)
Sodium: 140 mmol/L (ref 135–145)

## 2015-05-31 LAB — BLOOD GAS, ARTERIAL
Acid-Base Excess: 4.2 mmol/L — ABNORMAL HIGH (ref 0.0–2.0)
Bicarbonate: 28.5 mEq/L — ABNORMAL HIGH (ref 20.0–24.0)
O2 Content: 3 L/min
O2 Saturation: 97 %
PCO2 ART: 45.7 mmHg — AB (ref 35.0–45.0)
PH ART: 7.412 (ref 7.350–7.450)
PO2 ART: 92.1 mmHg (ref 80.0–100.0)
Patient temperature: 98.6
TCO2: 29.9 mmol/L (ref 0–100)

## 2015-05-31 LAB — GLUCOSE, CAPILLARY
GLUCOSE-CAPILLARY: 123 mg/dL — AB (ref 65–99)
GLUCOSE-CAPILLARY: 99 mg/dL (ref 65–99)
GLUCOSE-CAPILLARY: 104 mg/dL — AB (ref 65–99)

## 2015-05-31 MED ORDER — ENOXAPARIN SODIUM 40 MG/0.4ML ~~LOC~~ SOLN
40.0000 mg | Freq: Every day | SUBCUTANEOUS | Status: DC
  Administered 2015-05-31 – 2015-06-01 (×2): 40 mg via SUBCUTANEOUS
  Filled 2015-05-31 (×2): qty 0.4

## 2015-05-31 NOTE — Discharge Instructions (Signed)
Video Assisted Thoracoscopic Surgery, Care After °Refer to this sheet in the next few weeks. These instructions provide you with information on caring for yourself after your procedure. Your caregiver may also give you more specific instructions. Your procedure has been planned according to current medical practices, but problems sometimes occur. Call your caregiver if you have any problems or questions after your procedure. °HOME CARE INSTRUCTIONS  °· Only take over-the-counter or prescription medications as directed. °· Only take pain medications (narcotics) as directed. °· Do not drive until your caregiver approves. Driving while taking narcotics or soon after surgery can be dangerous, so discuss the specific timing with your caregiver. °· Avoid activities that use your chest muscles, such as lifting heavy objects, for at least 3-4 weeks.   °· Take deep breaths to expand the lungs and to protect against pneumonia. °· Do breathing exercises as directed by your caregiver. If you were given an incentive spirometer to help with breathing, use it as directed. °· You may resume a normal diet and activities when you feel you are able to or as directed. °· Do not take a bath until your caregiver says it is OK. Use the shower instead.   °· Keep the bandage (dressing) covering the area where the chest tube was inserted (incision site) dry for 48 hours. After 48 hours, remove the dressing unless there is new drainage. °· Remove dressings as directed by your caregiver. °· Change dressings if necessary or as directed. °· Keep all follow-up appointments. It is important for you to see your caregiver after surgery to discuss appropriate follow-up care and surveillance, if it is necessary. °SEEK MEDICAL CARE: °· You feel excessive or increasing pain at an incision site. °· You notice bleeding, skin irritation, drainage, swelling, or redness at an incision site. °· There is a bad smell coming from an incision or dressing. °· It  feels like your heart is fluttering or beating rapidly. °· Your pain medication does not relieve your pain. °SEEK IMMEDIATE MEDICAL CARE IF:  °· You have a fever.   °· You have chest pain.  °· You have a rash. °· You have shortness of breath. °· You have trouble breathing.   °· You feel weak, lightheaded, dizzy, or faint.   °MAKE SURE YOU:  °· Understand these instructions.   °· Will watch your condition.   °· Will get help right away if you are not doing well or get worse. °  °This information is not intended to replace advice given to you by your health care provider. Make sure you discuss any questions you have with your health care provider. °  °Document Released: 06/13/2012 Document Revised: 03/09/2014 Document Reviewed: 06/13/2012 °Elsevier Interactive Patient Education ©2016 [employer] ° °

## 2015-05-31 NOTE — Progress Notes (Signed)
1 Day Post-Op Procedure(s) (LRB): LEFT VIDEO ASSISTED THORACOSCOPY WITH BIOPSY (Left) LEFT LUNG BIOPSY (Left) Subjective: Some incisional pain, "not too bad"  Objective: Vital signs in last 24 hours: Temp:  [97.6 F (36.4 C)-98.7 F (37.1 C)] 97.6 F (36.4 C) (03/31 0311) Pulse Rate:  [51-102] 84 (03/31 0311) Cardiac Rhythm:  [-] Normal sinus rhythm (03/31 0311) Resp:  [10-26] 12 (03/31 0751) BP: (85-141)/(56-104) 109/56 mmHg (03/31 0311) SpO2:  [89 %-100 %] 99 % (03/31 0751) Arterial Line BP: (86-157)/(56-102) 118/102 mmHg (03/31 0311)  Hemodynamic parameters for last 24 hours:    Intake/Output from previous day: 03/30 0701 - 03/31 0700 In: 2605 [I.V.:2455; IV Piggyback:150] Out: 2870 [Urine:2650; Blood:50; Chest Tube:170] Intake/Output this shift:    General appearance: alert, cooperative and no distress Neurologic: intact Heart: regular rate and rhythm Lungs: diminished breath sounds right base Abdomen: normal findings: soft, non-tender no air leak  Lab Results:  Recent Labs  05/28/15 1142 05/31/15 0315  WBC 11.7* 15.8*  HGB 13.2 11.7*  HCT 38.5 36.2  PLT 243 232   BMET:  Recent Labs  05/28/15 1142 05/31/15 0315  NA 143 140  K 3.6 3.4*  CL 107 105  CO2 23 27  GLUCOSE 103* 116*  BUN 6 <5*  CREATININE 0.71 0.59  CALCIUM 8.8* 8.5*    PT/INR:  Recent Labs  05/28/15 1142  LABPROT 12.8  INR 0.95   ABG    Component Value Date/Time   PHART 7.412 05/31/2015 0320   HCO3 28.5* 05/31/2015 0320   TCO2 29.9 05/31/2015 0320   O2SAT 97.0 05/31/2015 0320   CBG (last 3)   Recent Labs  05/30/15 1652 05/31/15 0106 05/31/15 0648  GLUCAP 111* 123* 99    Assessment/Plan: S/P Procedure(s) (LRB): LEFT VIDEO ASSISTED THORACOSCOPY WITH BIOPSY (Left) LEFT LUNG BIOPSY (Left) -Doing well POD # 1 No air leak- CT to water seal Ambulate Advance diet SCD + enoxaparin for DVT prophylaxis PCA for pain control   LOS: 1 day    Melrose Nakayama 05/31/2015

## 2015-05-31 NOTE — Discharge Summary (Signed)
Physician Discharge Summary       [address]       [place] [address]             [phone]    Patient ID: Diane Howell MRN: [id number] DOB/AGE: Feb 05, 1941 75 y.o.  Admit date: 05/30/2015 Discharge date: 06/02/2015  Admission Diagnoses: ILD (interstitial lung disease) ([place])  Active Diagnoses:  1. Hyperlipidemia 2. Former Smoker -- 1.00 packs/day for 33 years 3. GERD (gastroesophageal reflux disease) 4. Diaphragm, eventration 5. Chronic back pain 6. OA (osteoarthritis) 7. Diverticulosis  Procedure (s):  Left video-assisted thoracoscopy with lung biopsy of left lower lobe x2 and left upper lobe x2. On-Q local anesthetic catheter placement by Dr. Roxan Hockey on 05/30/2015.   History of Presenting Illness: Diane Howell is a 75 year old woman with past medical history significant for eventration of the right hemidiaphragm, hyperlipidemia, gastroesophageal reflux, osteoporosis, fracture of sacrum, osteoarthritis, and chronic back pain. She has a remote history of tobacco abuse and smoked 1 pack a day for 33 years before quitting in 1988.   She had an episode of severe bronchitis in November 2015. She remembers having a cough for a long time after that but it ultimately resolved. Last fall she began to notice fatigue and exertional shortness of breath when walking or doing chores around the house. About 2 months ago, her cough returned. This was initially a dry cough but then became productive. She has not had any hemoptysis.   Dr. Maudie Mercury started her on [facility] a few weeks ago. It helped her shortness of breath and fatigue but it did not resolve her cough. She still has shortness of breath with exertion, but fatigue is more of a problem to her presently. She has oxygen at home, but does not use it all the time. She has chronic back pain.  Dr. Roxan Hockey discussed the proposed procedure of left VATS, lung biopsy, possible bronchoscopy with Mrs. Stansberry. Dr.  Roxan Hockey informed her of the need for general anesthesia, the incisions to be used, use of drainage tubes postoperatively, the expected hospital stay and the overall recovery. She understands this is a diagnostic procedure not therapeutic. Dr. Roxan Hockey informed her the indications, risks, benefits, and alternatives. She understands the risks include, but are not limited to death, MI, DVT, PE, bleeding, possible need for transfusion, infection, prolonged air leak, cardiac arrhythmias, as well as the possibility of other unforeseeable complications.  She does have evidence of coronary disease on CT, but has no anginal symptoms. I do not think any further workup is necessary for that preoperatively. She was admitted on 05/30/2015 in order to undergo a left VATS, lung biopsies, and On Q placement.   Brief Hospital Course:  She remained afebrile and hemodynamically stable. A line and foley were removed early in her post operative course. Chest tube output gradually decreased. There was no air leak. Chest tube was placed to water seal on 03/31. Follow up chest x ray remained stable. Chest tube and On Q were removed on 06/01/15. She is requiring 3 liters of oxygen via [place] and was using oxygen intermittently at home prior to this admission.  She is hypertensive, however per patient her BP usually runs low.  She does not wish to be placed on BP medications at this time and wants to follow up with her PCP. She is tolerating a diet. Her wound is continuing to heal and there is no sign of wound infection. She is felt surgically stable for discharge today.  Pathology remains pending.   Latest Vital Signs: Blood pressure 150/82, pulse 98, temperature 98.3 F (36.8 C), temperature source Oral, resp. rate 21, height 5\' 5"  (1.651 m), weight 150 lb (68.04 kg), SpO2 93 %.  Physical Exam: General appearance: alert, cooperative and no distress Neurologic: intact Heart: regular rate and rhythm Lungs: diminished  breath sounds right base Abdomen: normal findings: soft, non-tender no air leak  Discharge Condition:Surgically stable for discharge home  Recent laboratory studies:  Lab Results  Component Value Date   WBC 13.4* 06/01/2015   HGB 11.3* 06/01/2015   HCT 35.6* 06/01/2015   MCV 94.9 06/01/2015   PLT 213 06/01/2015   Lab Results  Component Value Date   NA 141 06/01/2015   K 3.7 06/01/2015   CL 104 06/01/2015   CO2 31 06/01/2015   CREATININE 0.61 06/01/2015   GLUCOSE 108* 06/01/2015    Diagnostic Studies:    Dg Chest Port 1 View  05/31/2015  CLINICAL DATA:  Left-sided pneumothorax with chest tube treatment EXAM: PORTABLE CHEST 1 VIEW COMPARISON:  Portable chest x-ray of May 30, 2015 FINDINGS: There remains elevation of the right hemidiaphragm. The aerated portion of the right lung is clear today. On the left no definite pneumothorax is observed. The left-sided chest tube is in stable position with the tip overlying the posterior aspect of the fourth rib. A small amount of subcutaneous emphysema in the lower left axillary region persists. The heart is normal in size. The pulmonary vascularity is not engorged. The right subclavian venous catheter tip projects over the proximal SVC. There remains a loop of bowel deep to the right hemidiaphragm above the dome of the liver. IMPRESSION: 1. No pneumothorax on the left. Stable chest tube. No significant pleural effusion. 2. Resolution of subsegmental atelectasis in the hypoinflated right lung. Persistent elevation of the right hemidiaphragm. 3. No evidence of CHF. Electronically Signed   By: David  Martinique M.D.   On: 05/31/2015 07:53    Discharge Medications:   Medication List    TAKE these medications        acetaminophen 325 MG tablet  Commonly known as:  TYLENOL  Take 650 mg by mouth every 6 (six) hours as needed.     ANORO ELLIPTA 62.5-25 MCG/INH Aepb  Generic drug:  umeclidinium-vilanterol  Inhale 1 puff into the lungs daily.      ASCOMP-CODEINE 50-325-40-30 MG capsule  Generic drug:  butalbital-aspirin-caffeine-codeine  Take 1 capsule by mouth 2 (two) times daily as needed for migraine. Generic fiorinal     butalbital-aspirin-caffeine 50-325-40 MG capsule  Commonly known as:  FIORINAL  Take 1 capsule by mouth 2 (two) times daily as needed for headache.     DEXILANT 60 MG capsule  Generic drug:  dexlansoprazole  Take 60 mg by mouth daily.     diazepam 5 MG tablet  Commonly known as:  VALIUM  Take 5 mg by mouth every 6 (six) hours as needed for anxiety.     fexofenadine 180 MG tablet  Commonly known as:  ALLEGRA  Take 180 mg by mouth daily.     HYDROcodone-acetaminophen 5-325 MG tablet  Commonly known as:  NORCO/VICODIN  Take 1 tablet by mouth every 6 (six) hours as needed for moderate pain.     OSTEO BI-FLEX ADV DOUBLE ST PO  Take by mouth daily.     simvastatin 40 MG tablet  Commonly known as:  ZOCOR  Take 40 mg by mouth daily.     SYSTANE BALANCE  0.6 % Soln  Generic drug:  Propylene Glycol  Place 1 drop into both eyes daily as needed (for dry eyes).        Follow Up Appointments: Follow-up Information    Follow up with Melrose Nakayama, MD On 06/18/2015.   Specialty:  Cardiothoracic Surgery   Why:  PA/LAT CXR to be taken (at [facility] which is in the same building as Dr. Leonarda Salon office) on 06/18/2015 at 10:00 am;Appointment time is at 10:45 am   Contact information:   [place] [address] [phone]       Signed: Cinda Quest 06/02/2015, 9:34 AM

## 2015-05-31 NOTE — Op Note (Signed)
NAMEARIYEL, DALSING NO.:  [id number]  MEDICAL RECORD NO.:  [id number]  LOCATION:  3S07C                        FACILITY:  [facility]  PHYSICIAN:  Revonda Standard. Roxan Hockey, M.D.DATE OF BIRTH:  [birth year]  DATE OF PROCEDURE:  05/30/2015 DATE OF DISCHARGE:                              OPERATIVE REPORT   PREOPERATIVE DIAGNOSIS:  Interstitial lung disease.  POSTOPERATIVE DIAGNOSIS:  Interstitial lung disease.  PROCEDURE:  Left video-assisted thoracoscopy with lung biopsy of left lower lobe x2 and left upper lobe x2.  On-Q local anesthetic catheter placement.  SURGEON:  Revonda Standard. Roxan Hockey, M.D.  ASSISTANT:  Lars Pinks, PA.  ANESTHESIA:  General.  FINDINGS:  Frozen section of left lower lobe biopsy revealed pulmonary fibrosis and interstitial disease.  CLINICAL NOTE:  Diane Howell is a 75 year old woman with interstitial lung disease.  She needs a lung biopsy for diagnostic purposes.  The indications, risks, benefits, and alternatives were discussed in detail with the patient.  She understood and accepted the risks and agreed to proceed.  OPERATIVE NOTE:  Ms. Perina was brought to the operating room on May 30, 2015.  She had induction of general anesthesia and was intubated with a double-lumen endotracheal tube.  A Foley catheter was placed. Sequential compressive devices were placed on the calves for DVT prophylaxis.  Intravenous antibiotics were administered.  She was placed in a right lateral decubitus position and the left chest was prepped and draped in usual sterile fashion.  Single lung ventilation of the right lung was initiated and was tolerated well throughout the procedure.  An incision made in the seventh intercostal space in the midaxillary line, and was carried through the skin and subcutaneous tissue.  A 5-mm port was inserted and the thoracoscope was advanced into the chest.  There was good isolation of the left lung.  A small working  incision was made in the fifth interspace anterolaterally.  No rib spreading was performed during the procedure.  Based on the CT, the most severe disease appeared to be at the base of the left lower lobe.  A wedge resection was performed of this area and the specimen was sent for frozen section. The wedge resection was performed with sequential firings of an Ethicon Echelon 60-mm stapler with gold cartridges.  Next, a biopsy was taken in a similar fashion from the posterolateral aspect of the left upper lobe along the fissure in another area that was relatively heavily involved on CT.  A biopsy then was taken from the tip of the lingula, which was involved to a lesser extent with the process.  These biopsies were sent for permanent pathology.  A second biopsy was taken from the left lower lobe adjacent to the area of heaviest involvement. The frozen section on the initial specimen returned showing pulmonary fibrosis and interstitial disease.  An On-Q local anesthetic catheter was placed through a separate stab incision posteriorly and tunneled into a subpleural location.  It was secured to skin with a 3-0 silk suture.  A 28-French chest tube was placed through the original port incision and secured with a 0 silk suture.  The left lung was reinflated.  The incision was closed  with a #1 Vicryl fascial suture followed by a 2-0 Vicryl subcutaneous suture and a 3-0 Vicryl subcuticular suture.  All sponge, needle, and instrument counts were correct at the end of the procedure.  The patient was taken from the operating room to the postanesthetic care unit, extubated, and in good condition.     Revonda Standard Roxan Hockey, M.D.     SCH/MEDQ  D:  05/30/2015  T:  05/31/2015  Job:  [id number]

## 2015-06-01 ENCOUNTER — Inpatient Hospital Stay (HOSPITAL_COMMUNITY): Payer: Medicare Other

## 2015-06-01 LAB — COMPREHENSIVE METABOLIC PANEL
ALBUMIN: 3.1 g/dL — AB (ref 3.5–5.0)
ALT: 12 U/L — AB (ref 14–54)
AST: 19 U/L (ref 15–41)
Alkaline Phosphatase: 51 U/L (ref 38–126)
Anion gap: 6 (ref 5–15)
BILIRUBIN TOTAL: 0.7 mg/dL (ref 0.3–1.2)
BUN: <5 mg/dL — ABNORMAL LOW (ref 6–20)
CALCIUM: 8.6 mg/dL — AB (ref 8.9–10.3)
CO2: 31 mmol/L (ref 22–32)
Chloride: 104 mmol/L (ref 101–111)
Creatinine, Ser: 0.61 mg/dL (ref 0.44–1.00)
Glucose, Bld: 108 mg/dL — ABNORMAL HIGH (ref 65–99)
Potassium: 3.7 mmol/L (ref 3.5–5.1)
Sodium: 141 mmol/L (ref 135–145)
TOTAL PROTEIN: 5.3 g/dL — AB (ref 6.5–8.1)

## 2015-06-01 LAB — CBC
HCT: 35.6 % — ABNORMAL LOW (ref 36.0–46.0)
Hemoglobin: 11.3 g/dL — ABNORMAL LOW (ref 12.0–15.0)
MCH: 30.1 pg (ref 26.0–34.0)
MCHC: 31.7 g/dL (ref 30.0–36.0)
MCV: 94.9 fL (ref 78.0–100.0)
PLATELETS: 213 10*3/uL (ref 150–400)
RBC: 3.75 MIL/uL — ABNORMAL LOW (ref 3.87–5.11)
RDW: 13.1 % (ref 11.5–15.5)
WBC: 13.4 10*3/uL — ABNORMAL HIGH (ref 4.0–10.5)

## 2015-06-01 MED ORDER — GUAIFENESIN ER 600 MG PO TB12
600.0000 mg | ORAL_TABLET | Freq: Two times a day (BID) | ORAL | Status: DC
  Administered 2015-06-01 – 2015-06-02 (×3): 600 mg via ORAL
  Filled 2015-06-01 (×3): qty 1

## 2015-06-01 NOTE — Progress Notes (Addendum)
      [address]       [place] [address]             [phone]      2 Days Post-Op Procedure(s) (LRB): LEFT VIDEO ASSISTED THORACOSCOPY WITH BIOPSY (Left) LEFT LUNG BIOPSY (Left)   Subjective:  Diane Howell complains of pain this morning.  States she feels better than yesterday, but notices it is hard to take a full deep breath.  She is coughing up some blood tinged sputum.  Objective: Vital signs in last 24 hours: Temp:  [97.8 F (36.6 C)-98.1 F (36.7 C)] 98.1 F (36.7 C) (04/01 0319) Pulse Rate:  [83-98] 87 (04/01 0744) Cardiac Rhythm:  [-] Normal sinus rhythm (04/01 0746) Resp:  [13-23] 19 (04/01 0746) BP: (101-129)/(48-68) 115/52 mmHg (04/01 0744) SpO2:  [94 %-100 %] 99 % (04/01 0810) Arterial Line BP: (87)/(75) 87/75 mmHg (03/31 1614) FiO2 (%):  [28 %] 28 % (04/01 0810)  Intake/Output from previous day: 03/31 0701 - 04/01 0700 In: 1446.7 [P.O.:120; I.V.:1326.7] Out: 2455 [Urine:2375; Chest Tube:80] Intake/Output this shift: Total I/O In: 390 [P.O.:240; I.V.:150] Out: -   General appearance: alert, cooperative and no distress Heart: regular rate and rhythm Lungs: diminished breath sounds right base Abdomen: soft, non-tender; bowel sounds normal; no masses,  no organomegaly Wound: clean and dry  Lab Results:  Recent Labs  05/31/15 0315 06/01/15 0514  WBC 15.8* 13.4*  HGB 11.7* 11.3*  HCT 36.2 35.6*  PLT 232 213   BMET:  Recent Labs  05/31/15 0315 06/01/15 0514  NA 140 141  K 3.4* 3.7  CL 105 104  CO2 27 31  GLUCOSE 116* 108*  BUN <5* <5*  CREATININE 0.59 0.61  CALCIUM 8.5* 8.6*    PT/INR: No results for input(s): LABPROT, INR in the last 72 hours. ABG    Component Value Date/Time   PHART 7.412 05/31/2015 0320   HCO3 28.5* 05/31/2015 0320   TCO2 29.9 05/31/2015 0320   O2SAT 97.0 05/31/2015 0320   CBG (last 3)   Recent Labs  05/31/15 0106 05/31/15 0648 05/31/15 1154  GLUCAP 123* 99 104*     Assessment/Plan: S/P Procedure(s) (LRB): LEFT VIDEO ASSISTED THORACOSCOPY WITH BIOPSY (Left) LEFT LUNG BIOPSY (Left)  1. Chest tube- no air leak, 80 cc output yesterday- CXR is free from pneumothorax 2. Pulm- worsening atelectasis vs. Infiltrate on CXR- will add flutter valve, continue pulm toilet 3. Decrease IV Fluids to KVO 4. Dispo- patient stable, probably remove final chest tube today, Mucinex for cough   LOS: 2 days    BARRETT, ERIN 06/01/2015  Patient seen and examined, agree with above Dc chest tube  Remo Lipps C. Roxan Hockey, MD [facility] [phone]

## 2015-06-02 ENCOUNTER — Inpatient Hospital Stay (HOSPITAL_COMMUNITY): Payer: Medicare Other

## 2015-06-02 NOTE — Progress Notes (Signed)
Removed pt's central line, pt tolerated well. Reviewed discharge instructions including medications, follow up appts and incision care. Pt verbalizes understanding. Pt will be discharged in wheelchair with sister in law. Consuelo Pandy RN

## 2015-06-02 NOTE — Progress Notes (Signed)
Wasted 58mls of fentanyl from PCA into sink, witnessed by Hubbard Robinson RN

## 2015-06-02 NOTE — Progress Notes (Addendum)
      [address]       [place] [address]             [phone]      3 Days Post-Op Procedure(s) (LRB): LEFT VIDEO ASSISTED THORACOSCOPY WITH BIOPSY (Left) LEFT LUNG BIOPSY (Left)   Subjective:  Ms. Diane Howell is feeling much better.  Her pain level is better after chest tube removal.  + ambulation.  Hoping to go home today  Objective: Vital signs in last 24 hours: Temp:  [98 F (36.7 C)-98.3 F (36.8 C)] 98.3 F (36.8 C) (04/02 0353) Pulse Rate:  [87-100] 98 (04/02 0810) Cardiac Rhythm:  [-] Sinus tachycardia (04/02 0844) Resp:  [16-21] 21 (04/02 0844) BP: (125-156)/(52-82) 150/82 mmHg (04/02 0810) SpO2:  [92 %-99 %] 93 % (04/02 0844)  Intake/Output from previous day: 04/01 0701 - 04/02 0700 In: 1292.3 [P.O.:960; I.V.:332.3] Out: 850 [Urine:850] Intake/Output this shift: Total I/O In: 480 [P.O.:480] Out: -   General appearance: alert, cooperative and no distress Heart: regular rate and rhythm Lungs: clear to auscultation bilaterally Abdomen: soft, non-tender; bowel sounds normal; no masses,  no organomegaly Wound: clean and dry  Lab Results:  Recent Labs  05/31/15 0315 06/01/15 0514  WBC 15.8* 13.4*  HGB 11.7* 11.3*  HCT 36.2 35.6*  PLT 232 213   BMET:  Recent Labs  05/31/15 0315 06/01/15 0514  NA 140 141  K 3.4* 3.7  CL 105 104  CO2 27 31  GLUCOSE 116* 108*  BUN <5* <5*  CREATININE 0.59 0.61  CALCIUM 8.5* 8.6*    PT/INR: No results for input(s): LABPROT, INR in the last 72 hours. ABG    Component Value Date/Time   PHART 7.412 05/31/2015 0320   HCO3 28.5* 05/31/2015 0320   TCO2 29.9 05/31/2015 0320   O2SAT 97.0 05/31/2015 0320   CBG (last 3)   Recent Labs  05/31/15 0106 05/31/15 0648 05/31/15 1154  GLUCAP 123* 99 104*    Assessment/Plan: S/P Procedure(s) (LRB): LEFT VIDEO ASSISTED THORACOSCOPY WITH BIOPSY (Left) LEFT LUNG BIOPSY (Left)  1. Chest tube removed yesterday- follow up CXR is free from  pneumothorax 2. CV- + HTN, per patient normally BP is low, she would prefer to have BP checked with primary doctor prior to being placed on medicine. 3. Pulm- off oxygen, continues to have cough, continue Mucinex, flutter valve 4. dispo- patient stable, will d/c home today    LOS: 3 days    BARRETT, ERIN 06/02/2015  Patient seen and examined, agree with above Dc today  Remo Lipps C. Roxan Hockey, MD [facility] [phone]

## 2015-06-06 ENCOUNTER — Other Ambulatory Visit: Payer: Self-pay | Admitting: *Deleted

## 2015-06-06 DIAGNOSIS — J849 Interstitial pulmonary disease, unspecified: Secondary | ICD-10-CM

## 2015-06-11 ENCOUNTER — Other Ambulatory Visit (HOSPITAL_COMMUNITY): Payer: Self-pay

## 2015-06-11 ENCOUNTER — Encounter (HOSPITAL_COMMUNITY): Payer: Self-pay

## 2015-06-11 NOTE — Anesthesia Postprocedure Evaluation (Signed)
Anesthesia Post Note  Patient: Diane Howell  Procedure(s) Performed: Procedure(s) (LRB): LEFT VIDEO ASSISTED THORACOSCOPY WITH BIOPSY (Left) LEFT LUNG BIOPSY (Left)  Patient location during evaluation: PACU Anesthesia Type: General Level of consciousness: awake and awake and alert Pain management: pain level controlled Vital Signs Assessment: post-procedure vital signs reviewed and stable Respiratory status: spontaneous breathing, nonlabored ventilation and respiratory function stable Cardiovascular status: blood pressure returned to baseline Anesthetic complications: no    Last Vitals:  Filed Vitals:   06/02/15 0810 06/02/15 0844  BP: 150/82   Pulse: 98   Temp:    Resp: 21 21    Last Pain:  Filed Vitals:   06/02/15 0849  PainSc: 1                  [name]

## 2015-06-16 ENCOUNTER — Telehealth: Payer: Self-pay | Admitting: Internal Medicine

## 2015-06-16 NOTE — Telephone Encounter (Signed)
Diane Howell  She has IPF on lung bx. How soon can you get her in to see me? Next 2 weeks. If not give her appt with TP to discuss Rx and diagnosis and brochure of drugs. Let know  Thanks  Dr. Brand Males, M.D., [facility].C.P Pulmonary and Critical Care Medicine Staff Physician [facility] Pager: [phone], If no answer or between  15:00h - 7:00h: call [phone]  06/16/2015 4:04 PM

## 2015-06-17 NOTE — Telephone Encounter (Signed)
With me in 3 weeks is fine - let patient know. If she is anxious then in 2 weeks. Just let me know with reply back what she decides  THanks  Dr. Brand Males, M.D., [facility].C.P Pulmonary and Critical Care Medicine Staff Physician [place] [facility] Pager: [phone], If no answer or between  15:00h - 7:00h: call [phone]  06/17/2015 10:55 AM

## 2015-06-17 NOTE — Telephone Encounter (Signed)
MR, you have openings 3 weeks out. Please advise if ok to book with you or would you like an appt within 2 weeks with TP. Thanks.

## 2015-06-17 NOTE — Telephone Encounter (Signed)
Called and spoke to pt. Appt made with TP on 06/21/15, pt requested sooner appt than 3 weeks out.   Will forward to MR as FYI.

## 2015-06-18 ENCOUNTER — Ambulatory Visit
Admission: RE | Admit: 2015-06-18 | Discharge: 2015-06-18 | Disposition: A | Discharge status: Home / Self Care | Payer: Medicare Other | Source: Ambulatory Visit | Attending: Thoracic Surgery (Cardiothoracic Vascular Surgery) | Admitting: Thoracic Surgery (Cardiothoracic Vascular Surgery)

## 2015-06-18 ENCOUNTER — Ambulatory Visit (INDEPENDENT_AMBULATORY_CARE_PROVIDER_SITE_OTHER): Payer: Self-pay | Admitting: Thoracic Surgery (Cardiothoracic Vascular Surgery)

## 2015-06-18 VITALS — BP 107/72 | HR 89 | Resp 16 | Ht 64.0 in | Wt 150.0 lb

## 2015-06-18 DIAGNOSIS — R0602 Shortness of breath: Secondary | ICD-10-CM | POA: Diagnosis not present

## 2015-06-18 DIAGNOSIS — J849 Interstitial pulmonary disease, unspecified: Secondary | ICD-10-CM

## 2015-06-18 DIAGNOSIS — Z09 Encounter for follow-up examination after completed treatment for conditions other than malignant neoplasm: Secondary | ICD-10-CM

## 2015-06-18 DIAGNOSIS — J84112 Idiopathic pulmonary fibrosis: Secondary | ICD-10-CM

## 2015-06-18 NOTE — Progress Notes (Signed)
[address]       [place] [address]             [phone]       HPI: Mrs. Cardwell returns today for follow-up of her recent biopsy.  She is a 75 year old woman with interstitial lung disease who had a left video-assisted thoracoscopic lung biopsy on 05/30/2015. She did well postoperatively and went home on day 3.  She is not having any significant incisional pain. She is on hydrocodone for back pain but is not having to take anything additional for her incision. Her breathing is unchanged.  She has an appointment with TammyParrett in Dr. Golden Pop office on Friday.  Past Medical History  Diagnosis Date  . Hyperlipidemia   . GERD (gastroesophageal reflux disease)   . Diverticulosis   . Diaphragm, eventration right  . Hematuria   . Fracture of sacrum ([place])   . Chronic back pain   . OA (osteoarthritis)   . COPD (chronic obstructive pulmonary disease) ([place])   . Pneumonia   . Depression   . Anxiety   . History of hiatal hernia     hx      Current Outpatient Prescriptions  Medication Sig Dispense Refill  . acetaminophen (TYLENOL) 325 MG tablet Take 650 mg by mouth every 6 (six) hours as needed.    . ASCOMP-CODEINE 50-325-40-30 MG capsule Take 1 capsule by mouth 2 (two) times daily as needed for migraine. Generic fiorinal  0  . butalbital-aspirin-caffeine (FIORINAL) 50-325-40 MG capsule Take 1 capsule by mouth 2 (two) times daily as needed for headache.    . dexlansoprazole (DEXILANT) 60 MG capsule Take 60 mg by mouth daily.    . diazepam (VALIUM) 5 MG tablet Take 5 mg by mouth every 6 (six) hours as needed for anxiety.    . fexofenadine (ALLEGRA) 180 MG tablet Take 180 mg by mouth daily.    Marland Kitchen HYDROcodone-acetaminophen (NORCO/VICODIN) 5-325 MG tablet Take 1 tablet by mouth every 6 (six) hours as needed for moderate pain.    . Misc Natural Products (OSTEO BI-FLEX ADV DOUBLE ST PO) Take by mouth daily.    Marland Kitchen Propylene Glycol (SYSTANE BALANCE) 0.6 % SOLN  Place 1 drop into both eyes daily as needed (for dry eyes).    . simvastatin (ZOCOR) 40 MG tablet Take 40 mg by mouth daily.    Marland Kitchen umeclidinium-vilanterol (ANORO ELLIPTA) 62.5-25 MCG/INH AEPB Inhale 1 puff into the lungs daily.     No current facility-administered medications for this visit.    Physical Exam BP 107/72 mmHg  Pulse 89  Resp 16  Ht 5\' 4"  (1.626 m)  Wt 150 lb (68.04 kg)  BMI 25.73 kg/m2  SpO25 72% 75 year old woman in no acute distress Alert and oriented 3 no focal deficits Cardiac regular rate and rhythm normal S1 and S2 Lungs diminished breath sounds right base, some basilar crackles bilaterally  Diagnostic Tests: CHEST 2 VIEW  COMPARISON: Portable chest x-ray of June 02, 2015 and March 28, 2015.  FINDINGS: There is chronic elevation of the right hemidiaphragm. On the left there is stable density just above the lateral costophrenic gutter. There is no alveolar infiltrate or pneumothorax or significant pleural effusion. There is a surgical suture line that is obliquely oriented over the left upper lung. On the right there is mild thickening of the minor fissure which is stable. The heart and pulmonary vascularity are normal. The bony thorax is unremarkable.  IMPRESSION: Improved aeration  of both lungs since the previous study. Chronic right hemidiaphragm elevation. Postsurgical changes in the left upper lobe. Stable scarring or less likely infiltrate laterally at the left lung base.   Electronically Signed  By: David Martinique M.D.  On: 06/18/2015 10:16  Impression: 75 year old woman with idiopathic pulmonary fibrosis who thoracoscopic lung biopsy on 05/30/2015. She is recovering well from the surgery with no significant issues.  She has minimal incisional pain and is not having to take any supplemental narcotics other than what she already takes for her chronic back pain.  She had questions about the prognosis, which I deferred to the  pulmonary specialist.  Plan: I will be happy to see her back any time in the future if I can be of any further assistance with her care.  Melrose Nakayama, MD [facility] [phone]

## 2015-06-21 ENCOUNTER — Ambulatory Visit (INDEPENDENT_AMBULATORY_CARE_PROVIDER_SITE_OTHER): Payer: Medicare Other | Admitting: Adult Health

## 2015-06-21 ENCOUNTER — Encounter: Payer: Self-pay | Admitting: Adult Health

## 2015-06-21 ENCOUNTER — Encounter (INDEPENDENT_AMBULATORY_CARE_PROVIDER_SITE_OTHER): Payer: Self-pay

## 2015-06-21 VITALS — BP 128/82 | HR 88 | Temp 98.0°F | Ht 64.0 in | Wt 149.0 lb

## 2015-06-21 DIAGNOSIS — J849 Interstitial pulmonary disease, unspecified: Secondary | ICD-10-CM | POA: Diagnosis not present

## 2015-06-21 MED ORDER — PIRFENIDONE 267 MG PO CAPS: 3.0000 | ORAL_CAPSULE | Freq: Three times a day (TID) | ORAL | Status: DC

## 2015-06-21 MED ORDER — CLOTRIMAZOLE 10 MG MT TROC: 10.0000 mg | Freq: Every day | OROMUCOSAL | Status: DC

## 2015-06-21 NOTE — Assessment & Plan Note (Signed)
S/p Left VATS showing chronic interstitial pneumonitis with interstitial fibrosis and honeycomb change. There was usual interstitial pneumonia pattern in the biopsy the lesion is consistent with idiopathic pulmonary fibrosis. We discussed her bx results and tx options .  Declines pulmonary rehab.  Vaccines discussed - to check with PCP  Begin Esbriet titrating dose once approved thru insurance +/- assistance program  Case discussed with Dr. Chase Caller

## 2015-06-21 NOTE — Patient Instructions (Addendum)
Information on  Esbriet given today to begin once covered from insurance. -will be in touch for coverage.  Call back if you change your mind for pulmonary rehab.  Check with primary MD regarding Pneumovax and Prevnar.  Follow up Dr. Chase Caller in 2 months and As needed

## 2015-06-21 NOTE — Progress Notes (Signed)
Subjective:    Patient ID: Diane Howell, female    DOB: 1940-11-19, 75 y.o.   MRN: [id number]  HPI  75 year old female former smoker seen for pulmonary consult 05/06/2015 for possible COPD plus or minus ILD with Dr. Chase Caller  Test Autoimmune and hypersensitivity pneumonitis panel 04/17/2015 is negative  Pulmonary function test between 03/01/2014 and 05/06/2015 is unchanged with an FVC of 1.87 L/68%. Total lung capacity of 3.45/68% and DLCO of 8.4/34%.   Chest x-ray 03/01/2014 that is reported as no active process but elevated right hemidiaphragm. I do not have his x-ray -Echocardiogram 03/08/2014 reported as normal ejection fraction with mild mitral regurgitation -Nuclear stress test 03/12/2014-reported as negative for ischemia Dr. Amanda Cockayne - PFT 03/01/2014: reported as moderately severe restrictive lung disease with severe perfusion defect . I personally visualized this PFT trace and shows FVC 1.8 L/64%, FEV1 1.4 L/67%, ratio of 78. No broncho-dilator response. Total lung capacity of 3.6 L/70% and DLCO of 8.1/33%.  Walking desaturation test 185 feet 3 laps: On room air desaturated to 84% on the second lap.   06/21/2015 Follow up : IPF  Patient returns for a 6 week follow-up. Patient was seen last month for pulmonary consult for suspected COPD versus ILD. Patient was sent for a surgical evaluation with Dr. Roxan Hockey and underwent a left video-assisted thorascopic lung biopsy on 05/30/2015. Biopsy showed active chronic interstitial pneumonitis with interstitial fibrosis and honeycomb change. There was usual interstitial pneumonia pattern in the biopsy the lesion is consistent with idiopathic pulmonary fibrosis. We reviewed patient's biopsy results and discuss possible treatment options including OFEV and Esbriet.  Pt handout reviewed and given to pt. Drug program papers filled out .  Recent labs showed nml LFT. Discussed serial labs requirements once drug is started.  She says she gets  winded with walking long distance but remains active Going to beach today. Does have dry cough  Most days  She denies any chest pain, orthopnea, PND, or increased leg swelling. Unsure about pneumonia vaccines.  We discussed pulmonary rehab she declines currenlty .    Past Medical History  Diagnosis Date  . Hyperlipidemia   . GERD (gastroesophageal reflux disease)   . Diverticulosis   . Diaphragm, eventration right  . Hematuria   . Fracture of sacrum ([place])   . Chronic back pain   . OA (osteoarthritis)   . COPD (chronic obstructive pulmonary disease) ([place])   . Pneumonia   . Depression   . Anxiety   . History of hiatal hernia     hx   Current Outpatient Prescriptions on File Prior to Visit  Medication Sig Dispense Refill  . acetaminophen (TYLENOL) 325 MG tablet Take 650 mg by mouth every 6 (six) hours as needed.    . ASCOMP-CODEINE 50-325-40-30 MG capsule Take 1 capsule by mouth 2 (two) times daily as needed for migraine. Generic fiorinal  0  . butalbital-aspirin-caffeine (FIORINAL) 50-325-40 MG capsule Take 1 capsule by mouth 2 (two) times daily as needed for headache.    . dexlansoprazole (DEXILANT) 60 MG capsule Take 60 mg by mouth daily.    . diazepam (VALIUM) 5 MG tablet Take 5 mg by mouth every 6 (six) hours as needed for anxiety.    . fexofenadine (ALLEGRA) 180 MG tablet Take 180 mg by mouth daily.    Marland Kitchen HYDROcodone-acetaminophen (NORCO/VICODIN) 5-325 MG tablet Take 1 tablet by mouth every 6 (six) hours as needed for moderate pain.    . Misc Natural Products (OSTEO BI-FLEX ADV  DOUBLE ST PO) Take by mouth daily.    Marland Kitchen Propylene Glycol (SYSTANE BALANCE) 0.6 % SOLN Place 1 drop into both eyes daily as needed (for dry eyes).    . simvastatin (ZOCOR) 40 MG tablet Take 40 mg by mouth daily.    Marland Kitchen umeclidinium-vilanterol (ANORO ELLIPTA) 62.5-25 MCG/INH AEPB Inhale 1 puff into the lungs daily.     No current facility-administered medications on file prior to visit.      Review of  Systems Constitutional:   No  weight loss, night sweats,  Fevers, chills,  +fatigue, or  lassitude.  HEENT:   No headaches,  Difficulty swallowing,  Tooth/dental problems, or  Sore throat,                No sneezing, itching, ear ache,  +asal congestion, post nasal drip,   CV:  No chest pain,  Orthopnea, PND, swelling in lower extremities, anasarca, dizziness, palpitations, syncope.   GI  No heartburn, indigestion, abdominal pain, nausea, vomiting, diarrhea, change in bowel habits, loss of appetite, bloody stools.   Resp:   No chest wall deformity  Skin: no rash or lesions.  GU: no dysuria, change in color of urine, no urgency or frequency.  No flank pain, no hematuria   MS:  No joint pain or swelling.  No decreased range of motion.  No back pain.  Psych:  No change in mood or affect. No depression or anxiety.  No memory loss.          Objective:   Physical Exam Filed Vitals:   06/21/15 1147  BP: 128/82  Pulse: 88  Temp: 98 F (36.7 C)  TempSrc: Oral  Height: 5\' 4"  (1.626 m)  Weight: 149 lb (67.586 kg)  SpO2: 95%   GEN: A/Ox3; pleasant , NAD, elderly   HEENT:  [place]/AT,  EACs-clear, TMs-wnl, NOSE-clear, THROAT-clear, no lesions, no postnasal drip or exudate noted.   NECK:  Supple w/ fair ROM; no JVD; normal carotid impulses w/o bruits; no thyromegaly or nodules palpated; no lymphadenopathy.  RESP  Faint BB crackles , .no accessory muscle use, no dullness to percussion  CARD:  RRR, no m/r/g  , no peripheral edema, pulses intact, no cyanosis or clubbing.  GI:   Soft & nt; nml bowel sounds; no organomegaly or masses detected.  Musco: Warm bil, no deformities or joint swelling noted.   Neuro: alert, no focal deficits noted.    Skin: Warm, no lesions or rashes  Sosha Shepherd NP-C   Pulmonary and Critical Care  06/21/2015       Assessment & Plan:

## 2015-06-24 DIAGNOSIS — J84112 Idiopathic pulmonary fibrosis: Secondary | ICD-10-CM | POA: Diagnosis not present

## 2015-06-24 DIAGNOSIS — R0689 Other abnormalities of breathing: Secondary | ICD-10-CM | POA: Diagnosis not present

## 2015-07-02 ENCOUNTER — Telehealth: Payer: Self-pay | Admitting: Adult Health

## 2015-07-02 DIAGNOSIS — M5441 Lumbago with sciatica, right side: Secondary | ICD-10-CM | POA: Diagnosis not present

## 2015-07-02 DIAGNOSIS — Z23 Encounter for immunization: Secondary | ICD-10-CM | POA: Diagnosis not present

## 2015-07-02 DIAGNOSIS — M5442 Lumbago with sciatica, left side: Secondary | ICD-10-CM | POA: Diagnosis not present

## 2015-07-02 NOTE — Telephone Encounter (Signed)
forwrding to [facility]

## 2015-07-02 NOTE — Telephone Encounter (Signed)
Pt received a letter from [facility] stating that insurance is not covering the [place].  Letter states that if Esbriet is to be continued we will need to initiate a PA through Medicare Part D If we wish to initiate PA we can call [phone]   Please advise MR. Thanks.

## 2015-07-03 NOTE — Telephone Encounter (Signed)
Pt has a place at the beach. She is wanting to leave tomorrow and be back at the end of this month but didn't know if this would be ok since we are still waiting on an answer for Esbriet.  Please advise if ok to travel for next few weeks.

## 2015-07-03 NOTE — Telephone Encounter (Signed)
Patient is wanting to go out of town tomorrow to [facility] and did not want to be out of state in case something happens with Esbriet.  CB [phone].

## 2015-07-04 NOTE — Telephone Encounter (Signed)
Yeah fine to travel. Apply sun screen at beach. Worsening dyspnea - go to ER / call us

## 2015-07-04 NOTE — Telephone Encounter (Signed)
Called spoke with pt. Reviewed MR's recs. She voiced understanding and had no further questions.

## 2015-07-08 ENCOUNTER — Telehealth: Payer: Self-pay | Admitting: *Deleted

## 2015-07-08 NOTE — Telephone Encounter (Signed)
Received form to be faxed back to [employer]. Filled out and faxed back in per [facility] request. Will await response.  Fax # [phone]

## 2015-07-08 NOTE — Telephone Encounter (Signed)
[place] with CB [phone], checking on status of PA.  She was made aware of Misty's notes today that this was just faxed today.  She will await fax and call back tomorrow if she has not recd this.

## 2015-07-10 NOTE — Telephone Encounter (Signed)
Esbriet has been denied. Will send denial to [place].

## 2015-07-11 NOTE — Telephone Encounter (Signed)
Esbriet will need an appeal, will await for appeal fax.

## 2015-07-15 NOTE — Telephone Encounter (Signed)
Patient called and states she received notification that PA for Esbriet has been denied. -prm

## 2015-07-16 NOTE — Telephone Encounter (Signed)
Denial received along with information containing how to request an appeal. Form placed in MR's lookat and phone note routed to MR LMOM TCB x1 to inform pt that we are working on trying to get her Esbriet covered

## 2015-07-17 NOTE — Telephone Encounter (Signed)
Patient called back returning our call. I advised that Diane Howell called to inform her that we are working on getting the rx for Esbriet covered. Patient voiced understanding w/ no further questions -prm

## 2015-07-24 DIAGNOSIS — R0689 Other abnormalities of breathing: Secondary | ICD-10-CM | POA: Diagnosis not present

## 2015-07-24 DIAGNOSIS — J84112 Idiopathic pulmonary fibrosis: Secondary | ICD-10-CM | POA: Diagnosis not present

## 2015-07-30 DIAGNOSIS — J069 Acute upper respiratory infection, unspecified: Secondary | ICD-10-CM | POA: Diagnosis not present

## 2015-08-01 NOTE — Telephone Encounter (Signed)
Spoke to [employer] rep at lunch today, she is going to have [employer] call office to get details on this- she helps with reimbursement and approvals for Esbriet. There is no reason as to why this pt's Esbriet should have been denied.  Will await [place]'s phone call.

## 2015-08-01 NOTE — Telephone Encounter (Signed)
Diane Howell  What do I need to do about this? She has biopsy proven IPF. How can this be denied? Sh is also below 80 and FVC 68% and dLCO 34%  The very candidats who were studied

## 2015-08-02 NOTE — Telephone Encounter (Signed)
Spoke with [facility]. He states that the Esbriet claim was denied due to medical neccesity. They would like to refer pt to the [facility] [employer] [place] while the appeal is pending. He will fax over a consent form to enroll the pt in ATC program. Fax number confirmed. It will come to Ashley's attention.  Routing to [place] for follow up.

## 2015-08-02 NOTE — Telephone Encounter (Signed)
Thanks  meanwhikle please ensure first avail fu with me.

## 2015-08-02 NOTE — Telephone Encounter (Signed)
Spoke with Diane Howell at [place], gave her patient information- she will look into this case further and see what else needs to be done to get pt approved.  Will await call back.  Pt is already scheduled on 09/20/15 with MR- no sooner openings with MR.

## 2015-08-02 NOTE — Telephone Encounter (Signed)
[employer]- [phone]

## 2015-08-05 NOTE — Telephone Encounter (Signed)
Form received and placed in MR's look-at for completion

## 2015-08-08 ENCOUNTER — Telehealth: Payer: Self-pay | Admitting: Internal Medicine

## 2015-08-08 NOTE — Telephone Encounter (Signed)
I do not fill these forms. You guys do. Not sure what the problem with her form was. She saw TP and the original was initiated (not sure who filled paper work at [employer])  and then denied. I then gave to Caryl Pina to complete and i signed it few days ago. Was that wrong too? I can sign more forms through 08/09/15    I had told Rosana Hoes that my rec was one centralized person to do these esbriet forms. Not really sure why we are having difficulties more with with this paient

## 2015-08-08 NOTE — Telephone Encounter (Signed)
Pt states that the information/forms our office faxed to [facility] for her Esbriet was incomplete.  Pt states that she was told that the MR did not answer all the questions required in order to process the medication.  Pt only has 4 days left of her Esbriet so she will be without her medication for about 10 days or more before receiving her next shipment.  Please advise Dr Chase Caller of any rec's for while the patient is out of medication.   Called [facility] [phone], spoke with Rep Medication approvail denied d/t not having proper clinical information within the approved timeframe.  An Appeal will need to be done in order to be approved.  Fax is being sent to our office regarding the appeal process.   Will send to MR for rec's.  Please advise Dr Chase Caller. Thanks.

## 2015-08-09 NOTE — Telephone Encounter (Signed)
This paperwork that MR is referring can't be located. I have called pt's insurance company. They are going to sending [place] another copy of pt's appeal paperwork to be filled out.  I called and spoke to the pt. She states that [employer] told her that if her insurance denied this medication that they would set her up with a $45 copay payment.  Called [place] and spoke with [place]. Pt has been approved for patient assistance for 1 year. They are going to faxing a form to [place], they need the pt's shipping address written legibly on this form. Will await this form.  Called and spoke with pt. She is aware of the above information. Pt does not want [place] to worry about doing an appeal with her insurance since she has been approved for patient assistance.  Appeal information was received but was shredded per pt's request not to pursue the appeal.

## 2015-08-09 NOTE — Telephone Encounter (Signed)
Diane Howell is going to check on this and get back with Triage. Will hold the message in triage.

## 2015-08-09 NOTE — Telephone Encounter (Signed)
Pt calling back got a call from [employer] she had a 30 day free trial and she is almost out and is needing [place] to arrange shipment case # [id number] [phone] this is [employer]

## 2015-08-12 NOTE — Telephone Encounter (Signed)
This form that is needed for pt's address was not received. Contacted [employer]. They are going to refax it to [place]. Will await forms.

## 2015-08-16 NOTE — Telephone Encounter (Signed)
This form has been received and faxed back. Nothing further was needed at this time.

## 2015-08-24 DIAGNOSIS — J84112 Idiopathic pulmonary fibrosis: Secondary | ICD-10-CM | POA: Diagnosis not present

## 2015-08-24 DIAGNOSIS — R0689 Other abnormalities of breathing: Secondary | ICD-10-CM | POA: Diagnosis not present

## 2015-08-29 ENCOUNTER — Telehealth: Payer: Self-pay | Admitting: Internal Medicine

## 2015-08-29 DIAGNOSIS — Z5181 Encounter for therapeutic drug level monitoring: Secondary | ICD-10-CM

## 2015-08-29 NOTE — Telephone Encounter (Signed)
D/w patient Diane Howell 6:10 PM 08/29/2015 - she has been getting esbriet for 2 months. Has fu 09/19/15. She is tolerating it well without problem. Right now in [place] . She states . So she cannot do LFT (she has never had LFT check so far)  til she come back to [facility] mid July 2017. So, please order LFT test for anytime after 09/10/15 so she can do it earlier than mhy visit   [place] [id number]

## 2015-08-30 NOTE — Telephone Encounter (Signed)
This has been completed. See 08/08/15 TE. Closing encounter.

## 2015-09-05 NOTE — Telephone Encounter (Signed)
lft ordered.  Nothing further needed.

## 2015-09-13 DIAGNOSIS — F419 Anxiety disorder, unspecified: Secondary | ICD-10-CM | POA: Diagnosis not present

## 2015-09-13 DIAGNOSIS — F329 Major depressive disorder, single episode, unspecified: Secondary | ICD-10-CM | POA: Diagnosis not present

## 2015-09-19 ENCOUNTER — Ambulatory Visit (INDEPENDENT_AMBULATORY_CARE_PROVIDER_SITE_OTHER): Payer: Medicare Other | Admitting: Internal Medicine

## 2015-09-19 ENCOUNTER — Other Ambulatory Visit (INDEPENDENT_AMBULATORY_CARE_PROVIDER_SITE_OTHER): Payer: Medicare Other

## 2015-09-19 ENCOUNTER — Encounter: Payer: Self-pay | Admitting: Internal Medicine

## 2015-09-19 VITALS — BP 110/78 | HR 104 | Ht 64.0 in | Wt 144.0 lb

## 2015-09-19 DIAGNOSIS — R112 Nausea with vomiting, unspecified: Secondary | ICD-10-CM | POA: Diagnosis not present

## 2015-09-19 DIAGNOSIS — J84112 Idiopathic pulmonary fibrosis: Secondary | ICD-10-CM | POA: Diagnosis not present

## 2015-09-19 DIAGNOSIS — Z5181 Encounter for therapeutic drug level monitoring: Secondary | ICD-10-CM

## 2015-09-19 LAB — HEPATIC FUNCTION PANEL
ALBUMIN: 4.4 g/dL (ref 3.5–5.2)
ALT: 9 U/L (ref 0–35)
AST: 13 U/L (ref 0–37)
Alkaline Phosphatase: 60 U/L (ref 39–117)
Bilirubin, Direct: 0.1 mg/dL (ref 0.0–0.3)
TOTAL PROTEIN: 7.1 g/dL (ref 6.0–8.3)
Total Bilirubin: 0.5 mg/dL (ref 0.2–1.2)

## 2015-09-19 NOTE — Progress Notes (Signed)
Subjective:     Patient ID: Diane Howell, female   DOB: [birth year], 75 y.o.   MRN: [id number]  HPI   75 year old female former smoker seen for pulmonary consult 05/06/2015 for possible COPD plus or minus ILD with Dr. Chase Caller  Test Autoimmune and hypersensitivity pneumonitis panel 04/17/2015 is negative  Pulmonary function test between 03/01/2014 and 05/06/2015 is unchanged with an FVC of 1.87 L/68%. Total lung capacity of 3.45/68% and DLCO of 8.4/34%.   Chest x-ray 03/01/2014 that is reported as no active process but elevated right hemidiaphragm. I do not have his x-ray -Echocardiogram 03/08/2014 reported as normal ejection fraction with mild mitral regurgitation -Nuclear stress test 03/12/2014-reported as negative for ischemia Dr. Amanda Cockayne - PFT 03/01/2014: reported as moderately severe restrictive lung disease with severe perfusion defect . I personally visualized this PFT trace and shows FVC 1.8 L/64%, FEV1 1.4 L/67%, ratio of 78. No broncho-dilator response. Total lung capacity of 3.6 L/70% and DLCO of 8.1/33%.  Walking desaturation test 185 feet 3 laps: On room air desaturated to 84% on the second lap.   06/21/2015 Follow up : IPF  Patient returns for a 6 week follow-up. Patient was seen last month for pulmonary consult for suspected COPD versus ILD. Patient was sent for a surgical evaluation with Dr. Roxan Hockey and underwent a left video-assisted thorascopic lung biopsy on 05/30/2015. Biopsy showed active chronic interstitial pneumonitis with interstitial fibrosis and honeycomb change. There was usual interstitial pneumonia pattern in the biopsy the lesion is consistent with idiopathic pulmonary fibrosis. We reviewed patient's biopsy results and discuss possible treatment options including OFEV and Esbriet.  Pt handout reviewed and given to pt. Drug program papers filled out .  Recent labs showed nml LFT. Discussed serial labs requirements once drug is started.  She says she gets  winded with walking long distance but remains active Going to beach today. Does have dry cough  Most days  She denies any chest pain, orthopnea, PND, or increased leg swelling. Unsure about pneumonia vaccines.  We discussed pulmonary rehab she declines currenlty .    OV 09/19/2015  Chief Complaint  Patient presents with  . Follow-up    sob, worse outside, coughing, wheezing,esbriet causing her to feel sick to her stomach    follow-up idiopathic pulmonry fibrossbiopsy diagnosis on 05/30/2015 with diagnoss given to the patient on04/21/2017. Has been on Pirfenidone [place]) Since early May 2017 mderate severity. Uses oxygen at night   -  This the firs follow-up since starting esbriet early may 2017. When I spoke To her 08/29/2015 she was feeling fine. But she tells me today that starting around then she started having nausea and occasional vomiting and occasional abdominal pain due to esbriet. She does not apply sunscreen and have reminded her to. She's been in the [place]. She does feel the humidity makes a more short of breath. A year ago she did not have the problem. She is concerned that she might also be having progressive IPF. There is no weight loss. A day or 2 ago she reduced her Pirfenidone (Esbriet) 22 pills 3 times a day and this has helped the symptoms. Symptoms are moderate intensity. Liver function test today is pending. She wants know her options. Cough is only mild. Walking desaturation test 185 feet 3 laps on room air: Lowest pulse ox was 90%    No results for input(s): AST, ALT, ALKPHOS, BILITOT, PROT, ALBUMIN, INR in the last 168 hours.     has a past medical history of  Hyperlipidemia; GERD (gastroesophageal reflux disease); Diverticulosis; Diaphragm, eventration (right); Hematuria; Fracture of sacrum ([place]); Chronic back pain; OA (osteoarthritis); COPD (chronic obstructive pulmonary disease) ([place]); Pneumonia; Depression; Anxiety; and History of hiatal hernia.   reports that  she quit smoking about 29 years ago. Her smoking use included Cigarettes. She has a 33 pack-year smoking history. She has never used smokeless tobacco.  Past Surgical History  Procedure Laterality Date  . Breast reduction surgery Bilateral   . Rotator cuff repair Right   . Total abdominal hysterectomy    . Dilation and curettage of uterus    . Tonsillectomy    . Joint replacement Right 2010    hip  . Vascular surgery    . Video assisted thoracoscopy Left 05/30/2015    Procedure: LEFT VIDEO ASSISTED THORACOSCOPY WITH BIOPSY;  Surgeon: Melrose Nakayama, MD;  Location: [facility];  Service: Thoracic;  Laterality: Left;  . Lung biopsy Left 05/30/2015    Procedure: LEFT LUNG BIOPSY;  Surgeon: Melrose Nakayama, MD;  Location: [facility];  Service: Thoracic;  Laterality: Left;    Allergies  Allergen Reactions  . Dust Mite Extract Other (See Comments)    Per allergy testing   . Mold Extract [Trichophyton] Other (See Comments)    Per allergy testing, mildew also  . Other Other (See Comments)    Cigarette smoke per allergy testing, Perfumes per allergy testing  . Pollen Extract Other (See Comments)    Per allergy testing    Immunization History  Administered Date(s) Administered  . Pneumococcal-Unspecified 03/03/2015    Family History  Problem Relation Age of Onset  . Heart attack Father   . Heart failure Mother   . COPD Father   . Diabetes Mellitus II Father   . Breast cancer Mother   . Breast cancer Sister      Current outpatient prescriptions:  .  acetaminophen (TYLENOL) 325 MG tablet, Take 650 mg by mouth every 6 (six) hours as needed., Disp: , Rfl:  .  dexlansoprazole (DEXILANT) 60 MG capsule, Take 60 mg by mouth daily., Disp: , Rfl:  .  diazepam (VALIUM) 5 MG tablet, Take 5 mg by mouth every 6 (six) hours as needed for anxiety., Disp: , Rfl:  .  DULoxetine (CYMBALTA) 30 MG capsule, Take 30 mg by mouth daily., Disp: , Rfl:  .  fexofenadine (ALLEGRA) 180 MG tablet, Take 180  mg by mouth daily., Disp: , Rfl:  .  HYDROcodone-acetaminophen (NORCO/VICODIN) 5-325 MG tablet, Take 1 tablet by mouth every 6 (six) hours as needed for moderate pain., Disp: , Rfl:  .  Misc Natural Products (OSTEO BI-FLEX ADV DOUBLE ST PO), Take by mouth daily., Disp: , Rfl:  .  naproxen (NAPROSYN) 500 MG tablet, Take 500 mg by mouth every 12 (twelve) hours., Disp: , Rfl: 4 .  Pirfenidone (ESBRIET) 267 MG CAPS, Take 3 capsules by mouth 3 (three) times daily. First week 1 Three times a day  , 2 Three times a day  (2nd week ) then 3 Three times a day, Disp: , Rfl:  .  Propylene Glycol (SYSTANE BALANCE) 0.6 % SOLN, Place 1 drop into both eyes daily as needed (for dry eyes)., Disp: , Rfl:  .  simvastatin (ZOCOR) 40 MG tablet, Take 40 mg by mouth daily., Disp: , Rfl:  .  umeclidinium-vilanterol (ANORO ELLIPTA) 62.5-25 MCG/INH AEPB, Inhale 1 puff into the lungs daily., Disp: , Rfl:     Review of Systems     Objective:  Physical Exam  Constitutional: She is oriented to person, place, and time. She appears well-developed and well-nourished. No distress.  HENT:  Head: Normocephalic and atraumatic.  Right Ear: External ear normal.  Left Ear: External ear normal.  Mouth/Throat: Oropharynx is clear and moist. No oropharyngeal exudate.  Eyes: Conjunctivae and EOM are normal. Pupils are equal, round, and reactive to light. Right eye exhibits no discharge. Left eye exhibits no discharge. No scleral icterus.  Neck: Normal range of motion. Neck supple. No JVD present. No tracheal deviation present. No thyromegaly present.  Cardiovascular: Normal rate, regular rhythm, normal heart sounds and intact distal pulses.  Exam reveals no gallop and no friction rub.   No murmur heard. Pulmonary/Chest: Effort normal. No respiratory distress. She has no wheezes. She has rales. She exhibits no tenderness.  Abdominal: Soft. Bowel sounds are normal. She exhibits no distension and no mass. There is no tenderness. There  is no rebound and no guarding.  Musculoskeletal: Normal range of motion. She exhibits no edema or tenderness.  Lymphadenopathy:    She has no cervical adenopathy.  Neurological: She is alert and oriented to person, place, and time. She has normal reflexes. No cranial nerve deficit. She exhibits normal muscle tone. Coordination normal.  Skin: Skin is warm and dry. No rash noted. She is not diaphoretic. No erythema. No pallor.  Psychiatric: She has a normal mood and affect. Her behavior is normal. Judgment and thought content normal.  Vitals reviewed.  Filed Vitals:   09/19/15 1350  BP: 110/78  Pulse: 104  Height: 5\' 4"  (1.626 m)  Weight: 144 lb (65.318 kg)  SpO2: 92%    Body mass index is 24.71 kg/(m^2).      Assessment:       ICD-9-CM ICD-10-CM   1. IPF (idiopathic pulmonary fibrosis) (HCC) 516.31 J84.112 Pulmonary function test  2. Encounter for therapeutic drug monitoring V58.83 Z51.81 Pulmonary function test  3. Non-intractable vomiting with nausea, unspecified vomiting type 787.01 R11.2 Pulmonary function test       Plan:      Side effects from esbriet present Unclear if diseas has progressed since march 2017 or not; hard to tell given esbriet side effects  Plan - continue o2 at night - take esbriet with food at reduced dose of 2 pills three times daily  - do this till next visit  - take with food   - buy OTC Ginger capsule and take it after food 1-3 times a day when you take esbrietl; to see if nausea better   - await lft test; will let you know - do spirometry with DLCO in 2-3 weeks  Followup - pft test in 2-3 weeks to see me or app tammy  - return in 2-3 weeks to discuss with me progress on above regimen + results of breathing test  Dr. Brand Males, M.D., [facility].C.P Pulmonary and Critical Care Medicine Staff Physician [facility] Pager: [phone], If no answer or between  15:00h - 7:00h: call [phone]   [phone]  09/19/2015 2:35 PM

## 2015-09-19 NOTE — Patient Instructions (Addendum)
ICD-9-CM ICD-10-CM   1. IPF (idiopathic pulmonary fibrosis) (HCC) 516.31 J84.112   2. Encounter for therapeutic drug monitoring V58.83 Z51.81   3. Non-intractable vomiting with nausea, unspecified vomiting type 787.01 R11.2     Side effects from esbriet present Unclear if diseas has progressed since march 2017 or not; hard to tell given esbriet side effects  Plan - continue o2 at night - take esbriet with food at reduced dose of 2 pills three times daily  - do this till next visit  - take with food   - buy OTC Ginger capsule and take it after food 1-3 times a day when you take esbrietl; to see if nausea better   - await lft test; will let you know - do spirometry with DLCO in 2-3 weeks  Followup - pft test in 2-3 weeks to see me or app tammy  - return in 2-3 weeks to discuss with me progress on above regimen + results of breathing test

## 2015-09-23 DIAGNOSIS — J84112 Idiopathic pulmonary fibrosis: Secondary | ICD-10-CM | POA: Diagnosis not present

## 2015-09-23 DIAGNOSIS — R0689 Other abnormalities of breathing: Secondary | ICD-10-CM | POA: Diagnosis not present

## 2015-09-26 DIAGNOSIS — M549 Dorsalgia, unspecified: Secondary | ICD-10-CM | POA: Diagnosis not present

## 2015-10-07 ENCOUNTER — Telehealth: Payer: Self-pay | Admitting: Internal Medicine

## 2015-10-07 NOTE — Telephone Encounter (Signed)
Spoke with pt and she states that she has had continuing issues with daily headaches, abdominal pain, nausea and vomiting. She has called and canceled her Esbriet deliveries. She no longer wants to take the medication. She is open to discussing Ofev and has OV with TP 10/30/15.   MR - FYI. Thanks!

## 2015-10-08 NOTE — Telephone Encounter (Signed)
pls let her know that I support dc esbriet and she can discuss ofev with Tammy Parrett. Also, did he hear from the [facility]? I cannot rememer if I asked her about it. Is a patient support group

## 2015-10-08 NOTE — Telephone Encounter (Signed)
[phone] pt calling back

## 2015-10-08 NOTE — Telephone Encounter (Signed)
lmtcb x1 for pt. 

## 2015-10-08 NOTE — Telephone Encounter (Signed)
Spoke with pt, aware of recs. Pt states she had been receiving calls from the pulmonary fibrosis foundation, but these calls stopped about 1 month ago.  Forwarding to MR as FYI.

## 2015-10-09 NOTE — Telephone Encounter (Signed)
Let her know is patient support group and I am physician chair for it. We meet once a quarter. There are diferent topics discussed and have guest speakers etc., If she is interested I [place] Mr Marlane Mingle - local chair -  email her (wopld need her email) or call her  Thanks muich  Dr. Brand Males, M.D., [facility].C.P Pulmonary and Critical Care Medicine Staff Physician [place] [facility] Pager: [phone], If no answer or between  15:00h - 7:00h: call [phone]  10/09/2015 1:55 AM

## 2015-10-09 NOTE — Telephone Encounter (Signed)
Spoke with pt. She would like for Diane Howell to give her a call on her cell phone. Will route message back to MR.

## 2015-10-11 NOTE — Telephone Encounter (Signed)
Let Valli Glance know that I have informed Diane Howell via secure email to call her about PFF.  Thanks  Dr. Brand Males, M.D., [facility].C.P Pulmonary and Critical Care Medicine Staff Physician [place] [facility] Pager: [phone], If no answer or between  15:00h - 7:00h: call [phone]  10/11/2015 2:15 PM

## 2015-10-11 NOTE — Telephone Encounter (Signed)
Called and spoke to pt. Informed pt that Marlane Mingle is to call pt regarding PFF. Pt verbalized understanding and denied any further questions or concerns at this time.

## 2015-10-17 DIAGNOSIS — E559 Vitamin D deficiency, unspecified: Secondary | ICD-10-CM | POA: Diagnosis not present

## 2015-10-17 DIAGNOSIS — E7800 Pure hypercholesterolemia, unspecified: Secondary | ICD-10-CM | POA: Diagnosis not present

## 2015-10-17 DIAGNOSIS — J849 Interstitial pulmonary disease, unspecified: Secondary | ICD-10-CM | POA: Diagnosis not present

## 2015-10-23 DIAGNOSIS — E7800 Pure hypercholesterolemia, unspecified: Secondary | ICD-10-CM | POA: Diagnosis not present

## 2015-10-23 DIAGNOSIS — M810 Age-related osteoporosis without current pathological fracture: Secondary | ICD-10-CM | POA: Diagnosis not present

## 2015-10-24 DIAGNOSIS — R0689 Other abnormalities of breathing: Secondary | ICD-10-CM | POA: Diagnosis not present

## 2015-10-24 DIAGNOSIS — J84112 Idiopathic pulmonary fibrosis: Secondary | ICD-10-CM | POA: Diagnosis not present

## 2015-10-30 ENCOUNTER — Encounter: Payer: Self-pay | Admitting: Adult Health

## 2015-10-30 ENCOUNTER — Encounter (INDEPENDENT_AMBULATORY_CARE_PROVIDER_SITE_OTHER): Payer: Medicare Other | Admitting: Internal Medicine

## 2015-10-30 ENCOUNTER — Ambulatory Visit (INDEPENDENT_AMBULATORY_CARE_PROVIDER_SITE_OTHER): Payer: Medicare Other | Admitting: Adult Health

## 2015-10-30 DIAGNOSIS — R112 Nausea with vomiting, unspecified: Secondary | ICD-10-CM

## 2015-10-30 DIAGNOSIS — Z5181 Encounter for therapeutic drug level monitoring: Secondary | ICD-10-CM

## 2015-10-30 DIAGNOSIS — J84112 Idiopathic pulmonary fibrosis: Secondary | ICD-10-CM | POA: Diagnosis not present

## 2015-10-30 LAB — PULMONARY FUNCTION TEST
DL/VA % pred: 82 %
DL/VA: 3.95 ml/min/mmHg/L
DLCO COR % PRED: 40 %
DLCO COR: 9.87 ml/min/mmHg
DLCO unc % pred: 41 %
DLCO unc: 10.02 ml/min/mmHg
FEF 25-75 Pre: 1.22 L/sec
FEF2575-%PRED-PRE: 78 %
FEV1-%Pred-Pre: 65 %
FEV1-Pre: 1.32 L
FEV1FVC-%Pred-Pre: 109 %
FEV6-%PRED-PRE: 62 %
FEV6-Pre: 1.61 L
FEV6FVC-%Pred-Pre: 104 %
FVC-%PRED-PRE: 59 %
FVC-Pre: 1.63 L
PRE FEV1/FVC RATIO: 81 %
PRE FEV6/FVC RATIO: 99 %

## 2015-10-30 NOTE — Progress Notes (Signed)
Subjective:    Patient ID: Diane Howell, female    DOB: Apr 21, 1940, 75 y.o.   MRN: [id number]  HPI  75 year old female former smoker seen for pulmonary consult 05/06/2015 for possible COPD plus or minus ILD with Dr. Chase Caller  Test Autoimmune and hypersensitivity pneumonitis panel 04/17/2015 is negative  Pulmonary function test between 03/01/2014 and 05/06/2015 is unchanged with an FVC of 1.87 L/68%. Total lung capacity of 3.45/68% and DLCO of 8.4/34%.   Chest x-ray 03/01/2014 that is reported as no active process but elevated right hemidiaphragm. I do not have his x-ray -Echocardiogram 03/08/2014 reported as normal ejection fraction with mild mitral regurgitation -Nuclear stress test 03/12/2014-reported as negative for ischemia Dr. Amanda Cockayne - PFT 03/01/2014: reported as moderately severe restrictive lung disease with severe perfusion defect . I personally visualized this PFT trace and shows FVC 1.8 L/64%, FEV1 1.4 L/67%, ratio of 78. No broncho-dilator response. Total lung capacity of 3.6 L/70% and DLCO of 8.1/33%.  Walking desaturation test 185 feet 3 laps: On room air desaturated to 84% on the second lap.   10/30/2015 Follow up : IPF  Patient returns for a 4 week follow-up. Pt is followed for probable COPD + ILD. Patient was sent for a surgical evaluation with Dr. Roxan Hockey and underwent a left video-assisted thorascopic lung biopsy on 05/30/2015. Biopsy showed active chronic interstitial pneumonitis with interstitial fibrosis and honeycomb change. There was usual interstitial pneumonia pattern in the biopsy the lesion is consistent with idiopathic pulmonary fibrosis. She was started on Esbriet, this was approved and she began this in May .  Since then she has had daily headache , no energy and low appetite with nausea. She lost 10 lbs.  Says she stopped this a month ago and feels much better. She refuses to ever take this again.  We discussed OFEV , she declines at this time.    Spirometry with dlco today shows FEV1 at 65%, ratio 81, FVC 59%, DLCO 41%, DLCO is actually improved since March 2017 when it was at 34% Walk TEST in the office shows no desaturations. O2 90% on RA on walking. This is improved.   She says she gets winded with walking long distance but remains active Going to beach this week.  Does have dry cough  Most days . Feels mucinex helps with mucus well.  She denies any chest pain, orthopnea, PND, or increased leg swelling.   Past Medical History:  Diagnosis Date  . Anxiety   . Chronic back pain   . COPD (chronic obstructive pulmonary disease) ([place])   . Depression   . Diaphragm, eventration right  . Diverticulosis   . Fracture of sacrum ([place])   . GERD (gastroesophageal reflux disease)   . Hematuria   . History of hiatal hernia    hx  . Hyperlipidemia   . OA (osteoarthritis)   . Pneumonia    Current Outpatient Prescriptions on File Prior to Visit  Medication Sig Dispense Refill  . acetaminophen (TYLENOL) 325 MG tablet Take 650 mg by mouth every 6 (six) hours as needed.    Marland Kitchen dexlansoprazole (DEXILANT) 60 MG capsule Take 60 mg by mouth daily.    . diazepam (VALIUM) 5 MG tablet Take 5 mg by mouth every 6 (six) hours as needed for anxiety.    . DULoxetine (CYMBALTA) 30 MG capsule Take 30 mg by mouth daily.    . fexofenadine (ALLEGRA) 180 MG tablet Take 180 mg by mouth daily.    Marland Kitchen HYDROcodone-acetaminophen (NORCO/VICODIN)  5-325 MG tablet Take 1 tablet by mouth every 6 (six) hours as needed for moderate pain.    . Misc Natural Products (OSTEO BI-FLEX ADV DOUBLE ST PO) Take by mouth daily.    . naproxen (NAPROSYN) 500 MG tablet Take 500 mg by mouth every 12 (twelve) hours.  4  . Propylene Glycol (SYSTANE BALANCE) 0.6 % SOLN Place 1 drop into both eyes daily as needed (for dry eyes).    . simvastatin (ZOCOR) 40 MG tablet Take 40 mg by mouth daily.    Marland Kitchen umeclidinium-vilanterol (ANORO ELLIPTA) 62.5-25 MCG/INH AEPB Inhale 1 puff into the lungs  daily.    . Pirfenidone (ESBRIET) 267 MG CAPS Take 3 capsules by mouth 3 (three) times daily. First week 1 Three times a day  , 2 Three times a day  (2nd week ) then 3 Three times a day (Patient not taking: Reported on 10/30/2015)     No current facility-administered medications on file prior to visit.       Review of Systems Constitutional:   No  weight loss, night sweats,  Fevers, chills,  +fatigue, or  lassitude.  HEENT:   No headaches,  Difficulty swallowing,  Tooth/dental problems, or  Sore throat,              No sneezing, itching, ear ache, nasal congestion, post nasal drip,   CV:  No chest pain,  Orthopnea, PND, swelling in lower extremities, anasarca, dizziness, palpitations, syncope.   GI  No heartburn, indigestion, abdominal pain, nausea, vomiting, diarrhea, change in bowel habits, loss of appetite, bloody stools.   Resp:   No chest wall deformity  Skin: no rash or lesions.  GU: no dysuria, change in color of urine, no urgency or frequency.  No flank pain, no hematuria   MS:  No joint pain or swelling.  No decreased range of motion.  No back pain.  Psych:  No change in mood or affect. No depression or anxiety.  No memory loss.          Objective:   Physical Exam Vitals:   10/30/15 1208  BP: 102/70  Pulse: (!) 110  Temp: 98.2 F (36.8 C)  TempSrc: Oral  SpO2: 96%  Weight: 145 lb (65.8 kg)  Height: 5\' 4"  (1.626 m)   GEN: A/Ox3; pleasant , NAD, elderly    HEENT:  [place]/AT,  EACs-clear, TMs-wnl, NOSE-clear, THROAT-clear, no lesions, no postnasal drip or exudate noted.   NECK:  Supple w/ fair ROM; no JVD; normal carotid impulses w/o bruits; no thyromegaly or nodules palpated; no lymphadenopathy.    RESP  Faint BB crackles , . no accessory muscle use, no dullness to percussion  CARD:  RRR, no m/r/g  , no peripheral edema, pulses intact, no cyanosis or clubbing.  GI:   Soft & nt; nml bowel sounds; no organomegaly or masses detected.   Musco: Warm bil, no  deformities or joint swelling noted.   Neuro: alert, no focal deficits noted.    Skin: Warm, no lesions or rashes  Gwendlyn Hanback NP-C  [place] [facility]  10/30/2015       Assessment & Plan:

## 2015-10-30 NOTE — Patient Instructions (Signed)
Continue on current regimen  Follow up with Dr. Ramaswamy in 3 months and As needed    

## 2015-10-30 NOTE — Assessment & Plan Note (Signed)
IPF , bx proven, current stable with improved DLCO on spirometry and no desats with ambulation  Esbriet intolerant. Declines restart or OFEV initiation at this time   Plan  Patient Instructions  Continue on current regimen  Follow up with Dr. Chase Caller in 3 months and As needed

## 2015-11-12 ENCOUNTER — Telehealth: Payer: Self-pay | Admitting: Internal Medicine

## 2015-11-12 NOTE — Telephone Encounter (Signed)
Labs from PCP Jani Gravel, MD 10/17/15 reviewed - normal cbc, lft, Dr. Brand Males, M.D., [facility].C.P Pulmonary and Critical Care Medicine Staff Physician [place] [facility] Pager: [phone], If no answer or between  15:00h - 7:00h: call [phone]  11/12/2015 4:38 PM

## 2015-11-24 DIAGNOSIS — R0689 Other abnormalities of breathing: Secondary | ICD-10-CM | POA: Diagnosis not present

## 2015-11-24 DIAGNOSIS — J84112 Idiopathic pulmonary fibrosis: Secondary | ICD-10-CM | POA: Diagnosis not present

## 2015-12-24 DIAGNOSIS — J84112 Idiopathic pulmonary fibrosis: Secondary | ICD-10-CM | POA: Diagnosis not present

## 2015-12-24 DIAGNOSIS — R0689 Other abnormalities of breathing: Secondary | ICD-10-CM | POA: Diagnosis not present

## 2016-01-24 DIAGNOSIS — J84112 Idiopathic pulmonary fibrosis: Secondary | ICD-10-CM | POA: Diagnosis not present

## 2016-01-24 DIAGNOSIS — R0689 Other abnormalities of breathing: Secondary | ICD-10-CM | POA: Diagnosis not present

## 2016-01-29 DIAGNOSIS — M5441 Lumbago with sciatica, right side: Secondary | ICD-10-CM | POA: Diagnosis not present

## 2016-01-29 DIAGNOSIS — M5442 Lumbago with sciatica, left side: Secondary | ICD-10-CM | POA: Diagnosis not present

## 2016-01-30 ENCOUNTER — Ambulatory Visit (INDEPENDENT_AMBULATORY_CARE_PROVIDER_SITE_OTHER): Payer: Medicare Other | Admitting: Internal Medicine

## 2016-01-30 ENCOUNTER — Encounter: Payer: Self-pay | Admitting: Internal Medicine

## 2016-01-30 VITALS — BP 120/72 | HR 99 | Ht 64.0 in | Wt 151.0 lb

## 2016-01-30 DIAGNOSIS — R232 Flushing: Secondary | ICD-10-CM | POA: Insufficient documentation

## 2016-01-30 DIAGNOSIS — J84112 Idiopathic pulmonary fibrosis: Secondary | ICD-10-CM | POA: Diagnosis not present

## 2016-01-30 MED ORDER — ALBUTEROL SULFATE HFA 108 (90 BASE) MCG/ACT IN AERS: 2.0000 | INHALATION_SPRAY | Freq: Four times a day (QID) | RESPIRATORY_TRACT | 2 refills | Status: DC | PRN

## 2016-01-30 NOTE — Progress Notes (Signed)
Subjective:     Patient ID: Diane Howell, female   DOB: 29-Oct-1940, 75 y.o.   MRN: [id number]  HPI  75 year old female former smoker seen for pulmonary consult 05/06/2015 for possible COPD plus or minus ILD with Dr. Chase Caller  Test Autoimmune and hypersensitivity pneumonitis panel 04/17/2015 is negative  Pulmonary function test between 03/01/2014 and 05/06/2015 is unchanged with an FVC of 1.87 L/68%. Total lung capacity of 3.45/68% and DLCO of 8.4/34%.   Chest x-ray 03/01/2014 that is reported as no active process but elevated right hemidiaphragm. I do not have his x-ray -Echocardiogram 03/08/2014 reported as normal ejection fraction with mild mitral regurgitation -Nuclear stress test 03/12/2014-reported as negative for ischemia Dr. Amanda Cockayne - PFT 03/01/2014: reported as moderately severe restrictive lung disease with severe perfusion defect . I personally visualized this PFT trace and shows FVC 1.8 L/64%, FEV1 1.4 L/67%, ratio of 78. No broncho-dilator response. Total lung capacity of 3.6 L/70% and DLCO of 8.1/33%.  Walking desaturation test 185 feet 3 laps: On room air desaturated to 84% on the second lap.   10/30/2015 Follow up : IPF  Patient returns for a 4 week follow-up. Pt is followed for probable COPD + ILD. Patient was sent for a surgical evaluation with Dr. Roxan Hockey and underwent a left video-assisted thorascopic lung biopsy on 05/30/2015. Biopsy showed active chronic interstitial pneumonitis with interstitial fibrosis and honeycomb change. There was usual interstitial pneumonia pattern in the biopsy the lesion is consistent with idiopathic pulmonary fibrosis. She was started on Esbriet, this was approved and she began this in May .  Since then she has had daily headache , no energy and low appetite with nausea. She lost 10 lbs.  Says she stopped this a month ago and feels much better. She refuses to ever take this again.  We discussed OFEV , she declines at this time.  Spirometry  with dlco today shows FEV1 at 65%, ratio 81, FVC 59%, DLCO 41%, DLCO is actually improved since March 2017 when it was at 34% Walk TEST in the office shows no desaturations. O2 90% on RA on walking. This is improved.   She says she gets winded with walking long distance but remains active Going to beach this week.  Does have dry cough  Most days . Feels mucinex helps with mucus well.  She denies any chest pain, orthopnea, PND, or increased leg swelling.   OV 01/30/2016  Chief Complaint  Patient presents with  . Follow-up    56mo rov. pt pt states breathing is up & down. pt c/o sob with exertion & prod cough with clear mucus     Follow-up idiopathic pulmonary fibrosis biopsy-proven March 2017.  She did not tolerate Pirfenidone (Esbriet) in July August 2017. She stopped taking it. This because of GI side effects. She then refused to go on Ofev because of fear of side effects. At this point in time she is only on supportive therapy. She uses oxygen at night. Overall she says she is stable from a respiratory standpoint but in the last 1 month she has a new complaint of hot flashes. She insists is not associated with menopause. She's had over 20-30 episodes in the last 1 month. Last episode was 2-3 days ago. They're random. The episodic. No clear cut aggravating or relieving factors. Associated with significant diaphoresis. No associated fever or weight loss or hemoptysis. She is frustrated by symptoms.  Walking desaturation test on an 85 feet 3 laps on room [id number] at 1st ;  lp at 83% and HR rose from 95 to 112/min  Spirometry profile is as below is shows progressive decline in forced vital capacity which is pretty much without therapy following a natural course of the disease.   Results for Diane Howell, Diane Howell (MRN [id number]) as of 01/30/2016 14:00  Ref. Range 03/01/2014 16:03 05/06/2015 10:06 10/30/2015 11:19  FVC-Pre Latest Units: L 1.81 1.87 1.63  FVC-%Pred-Pre Latest Units: % 64 68 59    Results for Diane Howell, Diane Howell (MRN [id number]) as of 01/30/2016 14:00  Ref. Range 03/01/2014 16:03 05/06/2015 10:06 10/30/2015 11:19  DLCO unc Latest Units: ml/min/mmHg 8.10 8.41 10.02  DLCO unc % pred Latest Units: % 33 34 41    Immunization History  Administered Date(s) Administered  . Influenza, High Dose Seasonal PF 12/14/2015  . Pneumococcal-Unspecified 03/03/2015       has a past medical history of Anxiety; Chronic back pain; COPD (chronic obstructive pulmonary disease) ([place]); Depression; Diaphragm, eventration (right); Diverticulosis; Fracture of sacrum ([place]); GERD (gastroesophageal reflux disease); Hematuria; History of hiatal hernia; Hyperlipidemia; OA (osteoarthritis); and Pneumonia.   reports that she quit smoking about 29 years ago. Her smoking use included Cigarettes. She has a 33.00 pack-year smoking history. She has never used smokeless tobacco.  Past Surgical History:  Procedure Laterality Date  . BREAST REDUCTION SURGERY Bilateral   . DILATION AND CURETTAGE OF UTERUS    . JOINT REPLACEMENT Right 2010   hip  . LUNG BIOPSY Left 05/30/2015   Procedure: LEFT LUNG BIOPSY;  Surgeon: Melrose Nakayama, MD;  Location: [facility];  Service: Thoracic;  Laterality: Left;  . ROTATOR CUFF REPAIR Right   . TONSILLECTOMY    . TOTAL ABDOMINAL HYSTERECTOMY    . VASCULAR SURGERY    . VIDEO ASSISTED THORACOSCOPY Left 05/30/2015   Procedure: LEFT VIDEO ASSISTED THORACOSCOPY WITH BIOPSY;  Surgeon: Melrose Nakayama, MD;  Location: [facility];  Service: Thoracic;  Laterality: Left;    Allergies  Allergen Reactions  . Dust Mite Extract Other (See Comments)    Per allergy testing   . Mold Extract [Trichophyton] Other (See Comments)    Per allergy testing, mildew also  . Other Other (See Comments)    Cigarette smoke per allergy testing, Perfumes per allergy testing  . Pollen Extract Other (See Comments)    Per allergy testing    Immunization History  Administered Date(s) Administered   . Influenza, High Dose Seasonal PF 12/14/2015  . Pneumococcal-Unspecified 03/03/2015    Family History  Problem Relation Age of Onset  . Heart attack Father   . Heart failure Mother   . COPD Father   . Diabetes Mellitus II Father   . Breast cancer Mother   . Breast cancer Sister      Current Outpatient Prescriptions:  .  acetaminophen (TYLENOL) 325 MG tablet, Take 650 mg by mouth every 6 (six) hours as needed., Disp: , Rfl:  .  dexlansoprazole (DEXILANT) 60 MG capsule, Take 60 mg by mouth daily., Disp: , Rfl:  .  dextromethorphan-guaiFENesin (MUCINEX DM) 30-600 MG 12hr tablet, Take 1 tablet by mouth 2 (two) times daily., Disp: , Rfl:  .  diazepam (VALIUM) 5 MG tablet, Take 5 mg by mouth every 6 (six) hours as needed for anxiety., Disp: , Rfl:  .  DULoxetine (CYMBALTA) 30 MG capsule, Take 30 mg by mouth daily., Disp: , Rfl:  .  fexofenadine (ALLEGRA) 180 MG tablet, Take 180 mg by mouth daily., Disp: , Rfl:  .  HYDROcodone-acetaminophen (NORCO/VICODIN)  5-325 MG tablet, Take 1 tablet by mouth every 6 (six) hours as needed for moderate pain., Disp: , Rfl:  .  Misc Natural Products (OSTEO BI-FLEX ADV DOUBLE ST PO), Take by mouth daily., Disp: , Rfl:  .  naproxen (NAPROSYN) 500 MG tablet, Take 500 mg by mouth every 12 (twelve) hours., Disp: , Rfl: 4 .  Pirfenidone (ESBRIET) 267 MG CAPS, Take 3 capsules by mouth 3 (three) times daily. First week 1 Three times a day  , 2 Three times a day  (2nd week ) then 3 Three times a day, Disp: , Rfl:  .  Propylene Glycol (SYSTANE BALANCE) 0.6 % SOLN, Place 1 drop into both eyes daily as needed (for dry eyes)., Disp: , Rfl:  .  simvastatin (ZOCOR) 40 MG tablet, Take 40 mg by mouth daily., Disp: , Rfl:  .  tiZANidine (ZANAFLEX) 4 MG tablet, , Disp: , Rfl:  .  umeclidinium-vilanterol (ANORO ELLIPTA) 62.5-25 MCG/INH AEPB, Inhale 1 puff into the lungs daily., Disp: , Rfl:    Review of Systems     Objective:   Physical Exam  Constitutional: She is  oriented to person, place, and time. She appears well-developed and well-nourished. No distress.  HENT:  Head: Normocephalic and atraumatic.  Right Ear: External ear normal.  Left Ear: External ear normal.  Mouth/Throat: Oropharynx is clear and moist. No oropharyngeal exudate.  Eyes: Conjunctivae and EOM are normal. Pupils are equal, round, and reactive to light. Right eye exhibits no discharge. Left eye exhibits no discharge. No scleral icterus.  Neck: Normal range of motion. Neck supple. No JVD present. No tracheal deviation present. No thyromegaly present.  Cardiovascular: Normal rate, regular rhythm, normal heart sounds and intact distal pulses.  Exam reveals no gallop and no friction rub.   No murmur heard. Pulmonary/Chest: Effort normal. No respiratory distress. She has no wheezes. She has rales. She exhibits no tenderness.  Abdominal: Soft. Bowel sounds are normal. She exhibits no distension and no mass. There is no tenderness. There is no rebound and no guarding.  Musculoskeletal: Normal range of motion. She exhibits no edema or tenderness.  Lymphadenopathy:    She has no cervical adenopathy.  Neurological: She is alert and oriented to person, place, and time. She has normal reflexes. No cranial nerve deficit. She exhibits normal muscle tone. Coordination normal.  Skin: Skin is warm and dry. No rash noted. She is not diaphoretic. No erythema. No pallor.  Psychiatric: She has a normal mood and affect. Her behavior is normal. Judgment and thought content normal.  Vitals reviewed.   Vitals:   01/30/16 1356  BP: 120/72  Pulse: 99  SpO2: 90%  Weight: 151 lb (68.5 kg)  Height: 5\' 4"  (1.626 m)   Estimated body mass index is 25.92 kg/m as calculated from the following:   Height as of this encounter: 5\' 4"  (1.626 m).   Weight as of this encounter: 151 lb (68.5 kg).      Assessment:       ICD-9-CM ICD-10-CM   1. IPF (idiopathic pulmonary fibrosis) (HCC) 516.31 J84.112   2. Hot  flashes 782.62 R23.2        Plan:      Progressive disease based on walking desaturation test Hot flashes - new problem -  likely due to desaturations  Plan - start ambulatory portable o2 for exertion > 50 yards - continue night o2  - start ofev - discussed risks, benefits, limitations  - try to attend PFF support  group today  - wil consider you for research trials 2018  Followup  - 6 weeks from now with   - Pre-bd spiro and dlco only. No lung volume or bd response. No post-bd spiro aat 6 weks   Dr. Brand Males, M.D., [facility].C.P Pulmonary and Critical Care Medicine Staff Physician [place] [facility] Pager: [phone], If no answer or between  15:00h - 7:00h: call [phone]  01/30/2016 2:32 PM

## 2016-01-30 NOTE — Patient Instructions (Addendum)
ICD-9-CM ICD-10-CM   1. Hot flashes 782.62 R23.2   2. IPF (idiopathic pulmonary fibrosis) (HCC) 516.31 J84.112    Progressive disease based on walking desaturation test Hot flashes likely due to desaturations  Plan - start ambulatory portable o2 for exertion > 50 yards - continue night o2  - start ofev - discussed risks, benefits, limitations  - try to attend PFF support group today  - wil consider you for research trials 2018  Followup  - 6 weeks from now with   - Pre-bd spiro and dlco only. No lung volume or bd response. No post-bd spiro at 6 weeks

## 2016-01-30 NOTE — Addendum Note (Signed)
Addended by: Collier Salina on: 01/30/2016 05:00 PM   Modules accepted: Orders

## 2016-02-03 DIAGNOSIS — M129 Arthropathy, unspecified: Secondary | ICD-10-CM | POA: Diagnosis not present

## 2016-02-03 DIAGNOSIS — M5441 Lumbago with sciatica, right side: Secondary | ICD-10-CM | POA: Diagnosis not present

## 2016-02-03 DIAGNOSIS — M5442 Lumbago with sciatica, left side: Secondary | ICD-10-CM | POA: Diagnosis not present

## 2016-02-03 DIAGNOSIS — G8929 Other chronic pain: Secondary | ICD-10-CM | POA: Diagnosis not present

## 2016-02-06 ENCOUNTER — Telehealth: Payer: Self-pay | Admitting: Internal Medicine

## 2016-02-06 DIAGNOSIS — D1039 Benign neoplasm of other parts of mouth: Secondary | ICD-10-CM | POA: Diagnosis not present

## 2016-02-06 NOTE — Telephone Encounter (Signed)
Spoke with [facility]  They need ov note, ct chest and recent labs faxed to them at [phone]  This is in order to approve pt's OFEV  I have faxed all of the requested records  Nothing further needed

## 2016-02-10 ENCOUNTER — Telehealth: Payer: Self-pay

## 2016-02-10 NOTE — Telephone Encounter (Addendum)
Received call from [facility] with [facility] [phone]). Cloyde Reams states pt has been approved for Ofev and pt has a $45 monthly co-pay. Cloyde Reams states they will reach out to the pt to see if pt is able to afford this. LMTCB for pt to inform.

## 2016-02-10 NOTE — Telephone Encounter (Signed)
Pt. Returned our call gave her the message about her Ofev. Pt. Stated she would be able to afford the medication. Medication was already ordered according to [facility]. Nothing further is needed at this time.

## 2016-02-10 NOTE — Telephone Encounter (Signed)
Pt returned call. Jasmine, CMA, informed her of the co-pay. Pt aware and willing to continue with order. Nothing further needed at this time.

## 2016-02-13 ENCOUNTER — Telehealth: Payer: Self-pay | Admitting: Internal Medicine

## 2016-02-13 NOTE — Telephone Encounter (Signed)
Noted and will forward to MR to make him aware

## 2016-02-19 DIAGNOSIS — J84112 Idiopathic pulmonary fibrosis: Secondary | ICD-10-CM | POA: Diagnosis not present

## 2016-02-19 DIAGNOSIS — R0689 Other abnormalities of breathing: Secondary | ICD-10-CM | POA: Diagnosis not present

## 2016-02-23 DIAGNOSIS — R0689 Other abnormalities of breathing: Secondary | ICD-10-CM | POA: Diagnosis not present

## 2016-02-23 DIAGNOSIS — J84112 Idiopathic pulmonary fibrosis: Secondary | ICD-10-CM | POA: Diagnosis not present

## 2016-03-05 DIAGNOSIS — D1039 Benign neoplasm of other parts of mouth: Secondary | ICD-10-CM | POA: Diagnosis not present

## 2016-03-12 DIAGNOSIS — D1039 Benign neoplasm of other parts of mouth: Secondary | ICD-10-CM | POA: Diagnosis not present

## 2016-03-21 DIAGNOSIS — R0689 Other abnormalities of breathing: Secondary | ICD-10-CM | POA: Diagnosis not present

## 2016-03-21 DIAGNOSIS — J84112 Idiopathic pulmonary fibrosis: Secondary | ICD-10-CM | POA: Diagnosis not present

## 2016-03-24 DIAGNOSIS — H15843 Scleral ectasia, bilateral: Secondary | ICD-10-CM | POA: Diagnosis not present

## 2016-03-24 DIAGNOSIS — Z961 Presence of intraocular lens: Secondary | ICD-10-CM | POA: Diagnosis not present

## 2016-03-24 DIAGNOSIS — H40013 Open angle with borderline findings, low risk, bilateral: Secondary | ICD-10-CM | POA: Diagnosis not present

## 2016-03-24 DIAGNOSIS — H04123 Dry eye syndrome of bilateral lacrimal glands: Secondary | ICD-10-CM | POA: Diagnosis not present

## 2016-03-31 ENCOUNTER — Other Ambulatory Visit: Payer: Self-pay | Admitting: Internal Medicine

## 2016-03-31 DIAGNOSIS — R0600 Dyspnea, unspecified: Secondary | ICD-10-CM

## 2016-04-01 ENCOUNTER — Ambulatory Visit (INDEPENDENT_AMBULATORY_CARE_PROVIDER_SITE_OTHER): Payer: Medicare Other | Admitting: Internal Medicine

## 2016-04-01 ENCOUNTER — Encounter: Payer: Self-pay | Admitting: Internal Medicine

## 2016-04-01 VITALS — BP 118/66 | HR 87 | Ht 64.0 in | Wt 150.4 lb

## 2016-04-01 DIAGNOSIS — J84112 Idiopathic pulmonary fibrosis: Secondary | ICD-10-CM | POA: Diagnosis not present

## 2016-04-01 DIAGNOSIS — R0600 Dyspnea, unspecified: Secondary | ICD-10-CM | POA: Diagnosis not present

## 2016-04-01 LAB — PULMONARY FUNCTION TEST
DL/VA % PRED: 75 %
DL/VA: 3.62 ml/min/mmHg/L
DLCO COR % PRED: 41 %
DLCO COR: 10.01 ml/min/mmHg
DLCO UNC % PRED: 39 %
DLCO unc: 9.62 ml/min/mmHg
FEF 25-75 PRE: 1.41 L/s
FEF2575-%Pred-Pre: 92 %
FEV1-%Pred-Pre: 65 %
FEV1-Pre: 1.32 L
FEV1FVC-%PRED-PRE: 112 %
FEV6-%Pred-Pre: 61 %
FEV6-PRE: 1.58 L
FEV6FVC-%Pred-Pre: 105 %
FVC-%PRED-PRE: 58 %
FVC-Pre: 1.59 L
PRE FEV6/FVC RATIO: 99 %
Pre FEV1/FVC ratio: 83 %

## 2016-04-01 NOTE — Progress Notes (Signed)
Subjective:     Patient ID: Diane Howell, female   DOB: September 27, 1940, 76 y.o.   MRN: [id number]  HPI   76 year old female former smoker seen for pulmonary consult 05/06/2015 for possible COPD plus or minus ILD with Dr. Chase Caller  Test Autoimmune and hypersensitivity pneumonitis panel 04/17/2015 is negative  Pulmonary function test between 03/01/2014 and 05/06/2015 is unchanged with an FVC of 1.87 L/68%. Total lung capacity of 3.45/68% and DLCO of 8.4/34%.   Chest x-ray 03/01/2014 that is reported as no active process but elevated right hemidiaphragm. I do not have his x-ray -Echocardiogram 03/08/2014 reported as normal ejection fraction with mild mitral regurgitation -Nuclear stress test 03/12/2014-reported as negative for ischemia Dr. Amanda Cockayne - PFT 03/01/2014: reported as moderately severe restrictive lung disease with severe perfusion defect . I personally visualized this PFT trace and shows FVC 1.8 L/64%, FEV1 1.4 L/67%, ratio of 78. No broncho-dilator response. Total lung capacity of 3.6 L/70% and DLCO of 8.1/33%.  Walking desaturation test 185 feet 3 laps: On room air desaturated to 84% on the second lap.   10/30/2015 Follow up : IPF  Patient returns for a 4 week follow-up. Pt is followed for probable COPD + ILD. Patient was sent for a surgical evaluation with Dr. Roxan Hockey and underwent a left video-assisted thorascopic lung biopsy on 05/30/2015. Biopsy showed active chronic interstitial pneumonitis with interstitial fibrosis and honeycomb change. There was usual interstitial pneumonia pattern in the biopsy the lesion is consistent with idiopathic pulmonary fibrosis. She was started on Esbriet, this was approved and she began this in May .  Since then she has had daily headache , no energy and low appetite with nausea. She lost 10 lbs.  Says she stopped this a month ago and feels much better. She refuses to ever take this again.  We discussed OFEV , she declines at this time.   Spirometry with dlco today shows FEV1 at 65%, ratio 81, FVC 59%, DLCO 41%, DLCO is actually improved since March 2017 when it was at 34% Walk TEST in the office shows no desaturations. O2 90% on RA on walking. This is improved.   She says she gets winded with walking long distance but remains active Going to beach this week.  Does have dry cough  Most days . Feels mucinex helps with mucus well.  She denies any chest pain, orthopnea, PND, or increased leg swelling.   OV 01/30/2016  Chief Complaint  Patient presents with  . Follow-up    70mo rov. pt pt states breathing is up & down. pt c/o sob with exertion & prod cough with clear mucus     Follow-up idiopathic pulmonary fibrosis biopsy-proven March 2017.  She did not tolerate Pirfenidone (Esbriet) in July August 2017. She stopped taking it. This because of GI side effects. She then refused to go on Ofev because of fear of side effects. At this point in time she is only on supportive therapy. She uses oxygen at night. Overall she says she is stable from a respiratory standpoint but in the last 1 month she has a new complaint of hot flashes. She insists is not associated with menopause. She's had over 20-30 episodes in the last 1 month. Last episode was 2-3 days ago. They're random. The episodic. No clear cut aggravating or relieving factors. Associated with significant diaphoresis. No associated fever or weight loss or hemoptysis. She is frustrated by symptoms.  Walking desaturation test on an 85 feet 3 laps on room [id number] at  1st ;lp at 83% and HR rose from 95 to 112/min  Spirometry profile is as below is shows progressive decline in forced vital capacity which is pretty much without therapy following a natural course of the disease.   OV 04/01/2016  Chief Complaint  Patient presents with  . Follow-up    Pt here after PFT. Pt states she is not ready to wear O2 when walking. Pt states her breathing is doing well. Pt denies cough  and CP/tightness.     S: Follow-up idiopathic pulmonary fibrosis biopsy-proven March 2017. She has intolerance to Pirfenidone (Esbriet).  Last visit 01/30/2016 we decided to start Ofev. She has a supply with her but she is yet to start because of fear of GI side effects. She says she will plan to see her primary care physician and then, to starting in 04/22/2016. Overall she feels stable. She, [place] supposed to [place]. She does have baseline stable exertional dyspnea however Pulmicort function test shows continued decline of percent in 2 years and she surprised by it. She is now participating in the pulmonary fibrosis foundation patient support group  Results for Diane, Howell (MRN [id number]) as of 01/30/2016 14:00  Ref. Range 03/01/2014 16:03 05/06/2015 10:06 10/30/2015 11:19 04/01/2016   FVC-Pre Latest Units: L 1.81 1.87 1.63 1.59  FVC-%Pred-Pre Latest Units: % 64 68 59 58%   Results for Diane, Howell (MRN [id number]) as of 01/30/2016 14:00  Ref. Range 03/01/2014 16:03 05/06/2015 10:06 10/30/2015 11:19 04/01/2016   DLCO unc Latest Units: ml/min/mmHg 8.10 8.41 10.02 9.63  DLCO unc % pred Latest Units: % 33 34 41 75%       has a past medical history of Anxiety; Chronic back pain; COPD (chronic obstructive pulmonary disease) ([place]); Depression; Diaphragm, eventration (right); Diverticulosis; Fracture of sacrum ([place]); GERD (gastroesophageal reflux disease); Hematuria; History of hiatal hernia; Hyperlipidemia; OA (osteoarthritis); and Pneumonia.   reports that she quit smoking about 29 years ago. Her smoking use included Cigarettes. She has a 33.00 pack-year smoking history. She has never used smokeless tobacco.  Past Surgical History:  Procedure Laterality Date  . BREAST REDUCTION SURGERY Bilateral   . DILATION AND CURETTAGE OF UTERUS    . JOINT REPLACEMENT Right 2010   hip  . LUNG BIOPSY Left 05/30/2015   Procedure: LEFT LUNG BIOPSY;  Surgeon: Melrose Nakayama, MD;   Location: [facility];  Service: Thoracic;  Laterality: Left;  . ROTATOR CUFF REPAIR Right   . TONSILLECTOMY    . TOTAL ABDOMINAL HYSTERECTOMY    . VASCULAR SURGERY    . VIDEO ASSISTED THORACOSCOPY Left 05/30/2015   Procedure: LEFT VIDEO ASSISTED THORACOSCOPY WITH BIOPSY;  Surgeon: Melrose Nakayama, MD;  Location: [facility];  Service: Thoracic;  Laterality: Left;    Allergies  Allergen Reactions  . Dust Mite Extract Other (See Comments)    Per allergy testing   . Mold Extract [Trichophyton] Other (See Comments)    Per allergy testing, mildew also  . Other Other (See Comments)    Cigarette smoke per allergy testing, Perfumes per allergy testing  . Pollen Extract Other (See Comments)    Per allergy testing    Immunization History  Administered Date(s) Administered  . Influenza, High Dose Seasonal PF 12/14/2015  . Pneumococcal-Unspecified 03/03/2015    Family History  Problem Relation Age of Onset  . Heart attack Father   . Heart failure Mother   . COPD Father   . Diabetes Mellitus II Father   .  Breast cancer Mother   . Breast cancer Sister      Current Outpatient Prescriptions:  .  acetaminophen (TYLENOL) 325 MG tablet, Take 650 mg by mouth every 6 (six) hours as needed., Disp: , Rfl:  .  albuterol (PROVENTIL HFA;VENTOLIN HFA) 108 (90 Base) MCG/ACT inhaler, Inhale 2 puffs into the lungs every 6 (six) hours as needed for wheezing or shortness of breath., Disp: 1 Inhaler, Rfl: 2 .  dexlansoprazole (DEXILANT) 60 MG capsule, Take 60 mg by mouth daily., Disp: , Rfl:  .  dextromethorphan-guaiFENesin (MUCINEX DM) 30-600 MG 12hr tablet, Take 1 tablet by mouth 2 (two) times daily., Disp: , Rfl:  .  diazepam (VALIUM) 5 MG tablet, Take 5 mg by mouth every 6 (six) hours as needed for anxiety., Disp: , Rfl:  .  DULoxetine (CYMBALTA) 30 MG capsule, Take 30 mg by mouth daily., Disp: , Rfl:  .  fexofenadine (ALLEGRA) 180 MG tablet, Take 180 mg by mouth daily., Disp: , Rfl:  .   HYDROcodone-acetaminophen (NORCO/VICODIN) 5-325 MG tablet, Take 1 tablet by mouth every 6 (six) hours as needed for moderate pain., Disp: , Rfl:  .  Misc Natural Products (OSTEO BI-FLEX ADV DOUBLE ST PO), Take by mouth daily., Disp: , Rfl:  .  Propylene Glycol (SYSTANE BALANCE) 0.6 % SOLN, Place 1 drop into both eyes daily as needed (for dry eyes)., Disp: , Rfl:  .  simvastatin (ZOCOR) 40 MG tablet, Take 40 mg by mouth daily., Disp: , Rfl:  .  tiZANidine (ZANAFLEX) 4 MG tablet, , Disp: , Rfl:  .  umeclidinium-vilanterol (ANORO ELLIPTA) 62.5-25 MCG/INH AEPB, Inhale 1 puff into the lungs daily., Disp: , Rfl:     Review of Systems     Objective:   Physical Exam  Constitutional: She is oriented to person, place, and time. She appears well-developed and well-nourished. No distress.  HENT:  Head: Normocephalic and atraumatic.  Right Ear: External ear normal.  Left Ear: External ear normal.  Mouth/Throat: Oropharynx is clear and moist. No oropharyngeal exudate.  Eyes: Conjunctivae and EOM are normal. Pupils are equal, round, and reactive to light. Right eye exhibits no discharge. Left eye exhibits no discharge. No scleral icterus.  Neck: Normal range of motion. Neck supple. No JVD present. No tracheal deviation present. No thyromegaly present.  Cardiovascular: Normal rate, regular rhythm, normal heart sounds and intact distal pulses.  Exam reveals no gallop and no friction rub.   No murmur heard. Pulmonary/Chest: Effort normal. No respiratory distress. She has no wheezes. She has rales. She exhibits no tenderness.  Abdominal: Soft. Bowel sounds are normal. She exhibits no distension and no mass. There is no tenderness. There is no rebound and no guarding.  Musculoskeletal: Normal range of motion. She exhibits no edema or tenderness.  Lymphadenopathy:    She has no cervical adenopathy.  Neurological: She is alert and oriented to person, place, and time. She has normal reflexes. No cranial nerve  deficit. She exhibits normal muscle tone. Coordination normal.  Skin: Skin is warm and dry. No rash noted. She is not diaphoretic. No erythema. No pallor.  Psychiatric: She has a normal mood and affect. Her behavior is normal. Judgment and thought content normal.  Vitals reviewed.  Vitals:   04/01/16 1208  BP: 118/66  Pulse: 87  SpO2: 94%  Weight: 150 lb 6.4 oz (68.2 kg)  Height: 5\' 4"  (1.626 m)        Assessment:       ICD-9-CM ICD-10-CM  1. IPF (idiopathic pulmonary fibrosis) ([place]) 516.31 J84.112        Plan:      Mild progression in disease  Plan Start ofev as planned in feb 2018  Followup April 2018 or sooner if needed Appreciate interest in research trial participation - will let you know if we have any Walk test at followup in office (not 54mwd)     Dr. Brand Males, M.D., [facility].C.P Pulmonary and Critical Care Medicine Staff Physician [facility] Pager: [phone], If no answer or between  15:00h - 7:00h: call [phone]  04/01/2016 12:26 PM

## 2016-04-01 NOTE — Patient Instructions (Addendum)
ICD-9-CM ICD-10-CM   1. IPF (idiopathic pulmonary fibrosis) ([place]) 516.31 J84.112    Mild progression in disease  Plan Start ofev as planned in feb 2018  Followup April 2018 or sooner if needed Appreciate interest in research trial participation - will let you know if we have any Walk test at followup in office (not 54mwd)

## 2016-04-14 DIAGNOSIS — Z780 Asymptomatic menopausal state: Secondary | ICD-10-CM | POA: Diagnosis not present

## 2016-04-14 DIAGNOSIS — E7800 Pure hypercholesterolemia, unspecified: Secondary | ICD-10-CM | POA: Diagnosis not present

## 2016-04-14 DIAGNOSIS — R5383 Other fatigue: Secondary | ICD-10-CM | POA: Diagnosis not present

## 2016-04-14 DIAGNOSIS — M810 Age-related osteoporosis without current pathological fracture: Secondary | ICD-10-CM | POA: Diagnosis not present

## 2016-04-21 DIAGNOSIS — J84112 Idiopathic pulmonary fibrosis: Secondary | ICD-10-CM | POA: Diagnosis not present

## 2016-04-21 DIAGNOSIS — R739 Hyperglycemia, unspecified: Secondary | ICD-10-CM | POA: Diagnosis not present

## 2016-04-21 DIAGNOSIS — Z0000 Encounter for general adult medical examination without abnormal findings: Secondary | ICD-10-CM | POA: Diagnosis not present

## 2016-04-21 DIAGNOSIS — R0689 Other abnormalities of breathing: Secondary | ICD-10-CM | POA: Diagnosis not present

## 2016-04-30 ENCOUNTER — Telehealth: Payer: Self-pay | Admitting: Internal Medicine

## 2016-04-30 NOTE — Telephone Encounter (Signed)
Spoke with pt to get clarification. She states that she is currently taking Ofev 1 capsule BID. This is causing her diarrhea and she does not want to take this anymore. Stated, "I am in bad enough shape, I don't need that on top of everything else." Pt advised her specialty pharmacy that she will no longer be taking Ofev.  MR - FYI

## 2016-04-30 NOTE — Telephone Encounter (Signed)
LM x1 for patient Ofev and Esbriet added to allergy list

## 2016-04-30 NOTE — Telephone Encounter (Signed)
Please let her know we will consider her for a research study if we have one. Meanwhile, ok to stop ofev and please list both ofev and esbriet in allergies  Thanks  Dr. Brand Males, M.D., [facility].C.P Pulmonary and Critical Care Medicine Staff Physician [place] [facility] Pager: [phone], If no answer or between  15:00h - 7:00h: call [phone]  04/30/2016 4:48 PM

## 2016-05-01 NOTE — Telephone Encounter (Signed)
lmomtcb x 2  

## 2016-05-04 NOTE — Telephone Encounter (Signed)
lmtcb x3 for pt. 

## 2016-05-05 NOTE — Telephone Encounter (Signed)
Spoke with patient, states that she never took the Ofev so therefore never had any reaction of diarrhea. Unsure where this information came from as far as the reaction goes but the patient was very adamant that she has never started taking this and does not plan to start it. Patient would not elaborate on why she was not going to take the medication. Ofev allergy removed from chart. Will send to MR as FYI. Nothing further needed.

## 2016-05-19 DIAGNOSIS — R0689 Other abnormalities of breathing: Secondary | ICD-10-CM | POA: Diagnosis not present

## 2016-05-19 DIAGNOSIS — J84112 Idiopathic pulmonary fibrosis: Secondary | ICD-10-CM | POA: Diagnosis not present

## 2016-06-19 DIAGNOSIS — J84112 Idiopathic pulmonary fibrosis: Secondary | ICD-10-CM | POA: Diagnosis not present

## 2016-06-19 DIAGNOSIS — R0689 Other abnormalities of breathing: Secondary | ICD-10-CM | POA: Diagnosis not present

## 2016-06-22 NOTE — Progress Notes (Signed)
Pre and Post with DLCO completed.

## 2016-06-29 ENCOUNTER — Encounter: Payer: Self-pay | Admitting: Internal Medicine

## 2016-06-29 ENCOUNTER — Ambulatory Visit (INDEPENDENT_AMBULATORY_CARE_PROVIDER_SITE_OTHER): Payer: Medicare Other | Admitting: Internal Medicine

## 2016-06-29 DIAGNOSIS — R000 Tachycardia, unspecified: Secondary | ICD-10-CM | POA: Diagnosis not present

## 2016-06-29 DIAGNOSIS — J849 Interstitial pulmonary disease, unspecified: Secondary | ICD-10-CM | POA: Diagnosis not present

## 2016-06-29 DIAGNOSIS — J84112 Idiopathic pulmonary fibrosis: Secondary | ICD-10-CM

## 2016-06-29 MED ORDER — BISOPROLOL FUMARATE 5 MG PO TABS: 2.5000 mg | ORAL_TABLET | Freq: Every day | ORAL | 1 refills | Status: DC

## 2016-06-29 NOTE — Assessment & Plan Note (Signed)
This is due to your IPF and stress lung is putting on heart  Plan Start generic bisoprolol 2.5mg  per day   - can drop BP - if it does that or makes you dizzy stop  - if expensive call us See if this  Is helping your shortness of breath

## 2016-06-29 NOTE — Patient Instructions (Signed)
   Sinus tachycardia This is due to your IPF and stress lung is putting on heart  Plan Start generic bisoprolol 2.5mg  per day   - can drop BP - if it does that or makes you dizzy stop  - if expensive call us See if this  Is helping your shortness of breath  IPF (idiopathic pulmonary fibrosis) ([facility]) Stable clinically since last visit but slowly declining over time Too bad you are intolerant to both ofev and esbreit  Plan  - supportive care  - will consider referral to research trial in the future for patients like you - In 3 months do Pre-bd spiro and dlco only. No lung volume or bd response.   Followup  - 3 months after spirometry

## 2016-06-29 NOTE — Assessment & Plan Note (Signed)
Stable clinically since last visit but slowly declining over time Too bad you are intolerant to both ofev and esbreit  Plan  - supportive care  - will consider referral to research trial in the future for patients like you - In 3 months do Pre-bd spiro and dlco only. No lung volume or bd response.   Followup  - 3 months after spirometry

## 2016-06-29 NOTE — Addendum Note (Signed)
Addended by: Collier Salina on: 06/29/2016 10:34 AM   Modules accepted: Orders

## 2016-06-29 NOTE — Progress Notes (Signed)
Subjective:     Patient ID: Diane Howell, female   DOB: 1940/11/05, 76 y.o.   MRN: [id number]  HPI    76 year old female former smoker seen for pulmonary consult 05/06/2015 for possible COPD plus or minus ILD with Dr. Chase Caller  Test Autoimmune and hypersensitivity pneumonitis panel 04/17/2015 is negative  Pulmonary function test between 03/01/2014 and 05/06/2015 is unchanged with an FVC of 1.87 L/68%. Total lung capacity of 3.45/68% and DLCO of 8.4/34%.   Chest x-ray 03/01/2014 that is reported as no active process but elevated right hemidiaphragm. I do not have his x-ray -Echocardiogram 03/08/2014 reported as normal ejection fraction with mild mitral regurgitation -Nuclear stress test 03/12/2014-reported as negative for ischemia Dr. Amanda Cockayne - PFT 03/01/2014: reported as moderately severe restrictive lung disease with severe perfusion defect . I personally visualized this PFT trace and shows FVC 1.8 L/64%, FEV1 1.4 L/67%, ratio of 78. No broncho-dilator response. Total lung capacity of 3.6 L/70% and DLCO of 8.1/33%.  Walking desaturation test 185 feet 3 laps: On room air desaturated to 84% on the second lap.   10/30/2015 Follow up : IPF  Patient returns for a 4 week follow-up. Pt is followed for probable COPD + ILD. Patient was sent for a surgical evaluation with Dr. Roxan Hockey and underwent a left video-assisted thorascopic lung biopsy on 05/30/2015. Biopsy showed active chronic interstitial pneumonitis with interstitial fibrosis and honeycomb change. There was usual interstitial pneumonia pattern in the biopsy the lesion is consistent with idiopathic pulmonary fibrosis. She was started on Esbriet, this was approved and she began this in May .  Since then she has had daily headache , no energy and low appetite with nausea. She lost 10 lbs.  Says she stopped this a month ago and feels much better. She refuses to ever take this again.  We discussed OFEV , she declines at this time.   Spirometry with dlco today shows FEV1 at 65%, ratio 81, FVC 59%, DLCO 41%, DLCO is actually improved since March 2017 when it was at 34% Walk TEST in the office shows no desaturations. O2 90% on RA on walking. This is improved.   She says she gets winded with walking long distance but remains active Going to beach this week.  Does have dry cough  Most days . Feels mucinex helps with mucus well.  She denies any chest pain, orthopnea, PND, or increased leg swelling.   OV 01/30/2016  Chief Complaint  Patient presents with  . Follow-up    90mo rov. pt pt states breathing is up & down. pt c/o sob with exertion & prod cough with clear mucus     Follow-up idiopathic pulmonary fibrosis biopsy-proven March 2017.  She did not tolerate Pirfenidone (Esbriet) in July August 2017. She stopped taking it. This because of GI side effects. She then refused to go on Ofev because of fear of side effects. At this point in time she is only on supportive therapy. She uses oxygen at night. Overall she says she is stable from a respiratory standpoint but in the last 1 month she has a new complaint of hot flashes. She insists is not associated with menopause. She's had over 20-30 episodes in the last 1 month. Last episode was 2-3 days ago. They're random. The episodic. No clear cut aggravating or relieving factors. Associated with significant diaphoresis. No associated fever or weight loss or hemoptysis. She is frustrated by symptoms.  Walking desaturation test on an 85 feet 3 laps on room [id number]  at 1st ;lp at 83% and HR rose from 95 to 112/min  Spirometry profile is as below is shows progressive decline in forced vital capacity which is pretty much without therapy following a natural course of the disease.   OV 04/01/2016  Chief Complaint  Patient presents with  . Follow-up    Pt here after PFT. Pt states she is not ready to wear O2 when walking. Pt states her breathing is doing well. Pt denies cough  and CP/tightness.     S: Follow-up idiopathic pulmonary fibrosis biopsy-proven March 2017. She has intolerance to Pirfenidone (Esbriet).  Last visit 01/30/2016 we decided to start Ofev. She has a supply with her but she is yet to start because of fear of GI side effects. She says she will plan to see her primary care physician and then, to starting in 04/22/2016. Overall she feels stable. She, [place] supposed to [place]. She does have baseline stable exertional dyspnea however Pulmicort function test shows continued decline of percent in 2 years and she surprised by it. She is now participating in the pulmonary fibrosis foundation patient support group  OV 06/29/2016  Chief Complaint  Patient presents with  . Follow-up    Pt states she is no longer taking Ofev, GI symptoms. Pt states her breathing hasnt changed since last OV. Pt c/o dry cough. Pt denies CP/tightness and f/c/s.     Follow-up idiopathic pulmonary fibrosis biopsy-proven March 2017. Intolerant to Pirfenidone [facility]).  Last visit we started Ofev but she is intolerant to this to. Currently she is just on supportive care. Overall she feels stable. She does have some amount of shortness of breath with exertion. Walking desaturation test 185 feet 3 laps on room at: She desaturated second lap. It was noted that she was tachycardic at rest and this got worse 125 bpm with exertion. She is a participant in the support group. She has exertional oxygen. She is willing to participate in research trials. She is also open to the idea of trying a low-dose beta blocker even though she has low normal blood pressures in order to control the heart rate and see if this would help dyspnea.      Results for NIKELLE, MALATESTA (MRN [id number]) as of 01/30/2016 14:00  Ref. Range 03/01/2014 16:03 05/06/2015 10:06 10/30/2015 11:19 04/01/2016   FVC-Pre Latest Units: L 1.81 1.87 1.63 1.59  FVC-%Pred-Pre Latest Units: % 64 68 59 58%   Results  for FAYELYNN, DISTEL (MRN [id number]) as of 01/30/2016 14:00  Ref. Range 03/01/2014 16:03 05/06/2015 10:06 10/30/2015 11:19 04/01/2016   DLCO unc Latest Units: ml/min/mmHg 8.10 8.41 10.02 9.63  DLCO unc % pred Latest Units: % 33 34 41 75%         has a past medical history of Anxiety; Chronic back pain; COPD (chronic obstructive pulmonary disease) ([place]); Depression; Diaphragm, eventration (right); Diverticulosis; Fracture of sacrum ([place]); GERD (gastroesophageal reflux disease); Hematuria; History of hiatal hernia; Hyperlipidemia; OA (osteoarthritis); and Pneumonia.   reports that she quit smoking about 30 years ago. Her smoking use included Cigarettes. She has a 33.00 pack-year smoking history. She has never used smokeless tobacco.  Past Surgical History:  Procedure Laterality Date  . BREAST REDUCTION SURGERY Bilateral   . DILATION AND CURETTAGE OF UTERUS    . JOINT REPLACEMENT Right 2010   hip  . LUNG BIOPSY Left 05/30/2015   Procedure: LEFT LUNG BIOPSY;  Surgeon: Melrose Nakayama, MD;  Location: [facility];  Service: Thoracic;  Laterality: Left;  . ROTATOR CUFF REPAIR Right   . TONSILLECTOMY    . TOTAL ABDOMINAL HYSTERECTOMY    . VASCULAR SURGERY    . VIDEO ASSISTED THORACOSCOPY Left 05/30/2015   Procedure: LEFT VIDEO ASSISTED THORACOSCOPY WITH BIOPSY;  Surgeon: Melrose Nakayama, MD;  Location: [facility];  Service: Thoracic;  Laterality: Left;    Allergies  Allergen Reactions  . Dust Mite Extract Other (See Comments)    Per allergy testing   . Mold Extract [Trichophyton] Other (See Comments)    Per allergy testing, mildew also  . Ofev [Nintedanib]     GI symptoms  . Other Other (See Comments)    Cigarette smoke per allergy testing, Perfumes per allergy testing  . Pirfenidone   . Pollen Extract Other (See Comments)    Per allergy testing    Immunization History  Administered Date(s) Administered  . Influenza, High Dose Seasonal PF 12/14/2015  . Pneumococcal-Unspecified  03/03/2015    Family History  Problem Relation Age of Onset  . Heart attack Father   . Heart failure Mother   . COPD Father   . Diabetes Mellitus II Father   . Breast cancer Mother   . Breast cancer Sister      Current Outpatient Prescriptions:  .  acetaminophen (TYLENOL) 325 MG tablet, Take 650 mg by mouth every 6 (six) hours as needed., Disp: , Rfl:  .  albuterol (PROVENTIL HFA;VENTOLIN HFA) 108 (90 Base) MCG/ACT inhaler, Inhale 2 puffs into the lungs every 6 (six) hours as needed for wheezing or shortness of breath., Disp: 1 Inhaler, Rfl: 2 .  dexlansoprazole (DEXILANT) 60 MG capsule, Take 60 mg by mouth daily., Disp: , Rfl:  .  dextromethorphan-guaiFENesin (MUCINEX DM) 30-600 MG 12hr tablet, Take 1 tablet by mouth 2 (two) times daily., Disp: , Rfl:  .  diazepam (VALIUM) 5 MG tablet, Take 5 mg by mouth every 6 (six) hours as needed for anxiety., Disp: , Rfl:  .  DULoxetine (CYMBALTA) 30 MG capsule, Take 30 mg by mouth daily., Disp: , Rfl:  .  fexofenadine (ALLEGRA) 180 MG tablet, Take 180 mg by mouth daily., Disp: , Rfl:  .  HYDROcodone-acetaminophen (NORCO/VICODIN) 5-325 MG tablet, Take 1 tablet by mouth every 6 (six) hours as needed for moderate pain., Disp: , Rfl:  .  Misc Natural Products (OSTEO BI-FLEX ADV DOUBLE ST PO), Take by mouth daily., Disp: , Rfl:  .  Propylene Glycol (SYSTANE BALANCE) 0.6 % SOLN, Place 1 drop into both eyes daily as needed (for dry eyes)., Disp: , Rfl:  .  simvastatin (ZOCOR) 40 MG tablet, Take 40 mg by mouth daily., Disp: , Rfl:  .  tiZANidine (ZANAFLEX) 4 MG tablet, , Disp: , Rfl:  .  umeclidinium-vilanterol (ANORO ELLIPTA) 62.5-25 MCG/INH AEPB, Inhale 1 puff into the lungs daily., Disp: , Rfl:     Review of Systems     Objective:   Physical Exam  Constitutional: She is oriented to person, place, and time. She appears well-developed and well-nourished. No distress.  HENT:  Head: Normocephalic and atraumatic.  Right Ear: External ear normal.   Left Ear: External ear normal.  Mouth/Throat: Oropharynx is clear and moist. No oropharyngeal exudate.  Eyes: Conjunctivae and EOM are normal. Pupils are equal, round, and reactive to light. Right eye exhibits no discharge. Left eye exhibits no discharge. No scleral icterus.  Neck: Normal range of motion. Neck supple. No JVD present. No tracheal deviation present. No thyromegaly present.  Cardiovascular: Normal  rate, regular rhythm, normal heart sounds and intact distal pulses.  Exam reveals no gallop and no friction rub.   No murmur heard. Pulmonary/Chest: Effort normal. No respiratory distress. She has no wheezes. She has rales. She exhibits no tenderness.  Abdominal: Soft. Bowel sounds are normal. She exhibits no distension and no mass. There is no tenderness. There is no rebound and no guarding.  Musculoskeletal: Normal range of motion. She exhibits no edema or tenderness.  Lymphadenopathy:    She has no cervical adenopathy.  Neurological: She is alert and oriented to person, place, and time. She has normal reflexes. No cranial nerve deficit. She exhibits normal muscle tone. Coordination normal.  Skin: Skin is warm and dry. No rash noted. She is not diaphoretic. No erythema. No pallor.  Psychiatric: She has a normal mood and affect. Her behavior is normal. Judgment and thought content normal.  Vitals reviewed.  Vitals:   06/29/16 1004  BP: 106/80  Pulse: (!) 103  SpO2: 97%  Weight: 141 lb (64 kg)  Height: 5\' 4"  (1.626 m)    Estimated body mass index is 24.2 kg/m as calculated from the following:   Height as of this encounter: 5\' 4"  (1.626 m).   Weight as of this encounter: 141 lb (64 kg).      Assessment:       ICD-9-CM ICD-10-CM   1. ILD (interstitial lung disease) ([place]) 515 J84.9   2. Sinus tachycardia 427.89 R00.0   3. IPF (idiopathic pulmonary fibrosis) ([facility]) 516.31 J84.112        Plan:      Sinus tachycardia This is due to your IPF and stress lung is putting on  heart  Plan Start generic bisoprolol 2.5mg  per day   - can drop BP - if it does that or makes you dizzy stop  - if expensive call us See if this  Is helping your shortness of breath  IPF (idiopathic pulmonary fibrosis) ([facility]) Stable clinically since last visit but slowly declining over time Too bad you are intolerant to both ofev and esbreit  Plan  - supportive care  - will consider referral to research trial in the future for patients like you - In 3 months do Pre-bd spiro and dlco only. No lung volume or bd response.   Followup  - 3 months after spirometry       Dr. Brand Males, M.D., [facility].C.P Pulmonary and Critical Care Medicine Staff Physician [place] [facility] Pager: [phone], If no answer or between  15:00h - 7:00h: call [phone]  06/29/2016 10:29 AM

## 2016-07-19 DIAGNOSIS — R0689 Other abnormalities of breathing: Secondary | ICD-10-CM | POA: Diagnosis not present

## 2016-07-19 DIAGNOSIS — J84112 Idiopathic pulmonary fibrosis: Secondary | ICD-10-CM | POA: Diagnosis not present

## 2016-08-01 DIAGNOSIS — J449 Chronic obstructive pulmonary disease, unspecified: Secondary | ICD-10-CM | POA: Diagnosis not present

## 2016-08-01 DIAGNOSIS — M79672 Pain in left foot: Secondary | ICD-10-CM | POA: Diagnosis not present

## 2016-08-01 DIAGNOSIS — M25572 Pain in left ankle and joints of left foot: Secondary | ICD-10-CM | POA: Diagnosis not present

## 2016-08-01 DIAGNOSIS — M79605 Pain in left leg: Secondary | ICD-10-CM | POA: Diagnosis not present

## 2016-08-19 DIAGNOSIS — R0689 Other abnormalities of breathing: Secondary | ICD-10-CM | POA: Diagnosis not present

## 2016-08-19 DIAGNOSIS — J84112 Idiopathic pulmonary fibrosis: Secondary | ICD-10-CM | POA: Diagnosis not present

## 2016-08-25 ENCOUNTER — Other Ambulatory Visit: Payer: Self-pay | Admitting: Internal Medicine

## 2016-08-25 DIAGNOSIS — J849 Interstitial pulmonary disease, unspecified: Secondary | ICD-10-CM

## 2016-09-18 DIAGNOSIS — J84112 Idiopathic pulmonary fibrosis: Secondary | ICD-10-CM | POA: Diagnosis not present

## 2016-09-18 DIAGNOSIS — R0689 Other abnormalities of breathing: Secondary | ICD-10-CM | POA: Diagnosis not present

## 2016-10-05 ENCOUNTER — Other Ambulatory Visit: Payer: Self-pay | Admitting: Internal Medicine

## 2016-10-05 DIAGNOSIS — J849 Interstitial pulmonary disease, unspecified: Secondary | ICD-10-CM

## 2016-10-06 ENCOUNTER — Other Ambulatory Visit: Payer: Self-pay | Admitting: Internal Medicine

## 2016-10-06 DIAGNOSIS — J849 Interstitial pulmonary disease, unspecified: Secondary | ICD-10-CM

## 2016-10-11 DIAGNOSIS — M549 Dorsalgia, unspecified: Secondary | ICD-10-CM | POA: Diagnosis not present

## 2016-10-13 DIAGNOSIS — Z0000 Encounter for general adult medical examination without abnormal findings: Secondary | ICD-10-CM | POA: Diagnosis not present

## 2016-10-13 DIAGNOSIS — Z5181 Encounter for therapeutic drug level monitoring: Secondary | ICD-10-CM | POA: Diagnosis not present

## 2016-10-13 DIAGNOSIS — E7800 Pure hypercholesterolemia, unspecified: Secondary | ICD-10-CM | POA: Diagnosis not present

## 2016-10-19 DIAGNOSIS — E7800 Pure hypercholesterolemia, unspecified: Secondary | ICD-10-CM | POA: Diagnosis not present

## 2016-10-19 DIAGNOSIS — J849 Interstitial pulmonary disease, unspecified: Secondary | ICD-10-CM | POA: Diagnosis not present

## 2016-10-19 DIAGNOSIS — J84112 Idiopathic pulmonary fibrosis: Secondary | ICD-10-CM | POA: Diagnosis not present

## 2016-10-19 DIAGNOSIS — Z79899 Other long term (current) drug therapy: Secondary | ICD-10-CM | POA: Diagnosis not present

## 2016-10-19 DIAGNOSIS — R0689 Other abnormalities of breathing: Secondary | ICD-10-CM | POA: Diagnosis not present

## 2016-11-01 ENCOUNTER — Other Ambulatory Visit: Payer: Self-pay | Admitting: Internal Medicine

## 2016-11-01 DIAGNOSIS — J849 Interstitial pulmonary disease, unspecified: Secondary | ICD-10-CM

## 2016-11-10 ENCOUNTER — Other Ambulatory Visit: Payer: Self-pay | Admitting: Internal Medicine

## 2016-11-19 DIAGNOSIS — J84112 Idiopathic pulmonary fibrosis: Secondary | ICD-10-CM | POA: Diagnosis not present

## 2016-11-19 DIAGNOSIS — R0689 Other abnormalities of breathing: Secondary | ICD-10-CM | POA: Diagnosis not present

## 2016-12-19 DIAGNOSIS — R0689 Other abnormalities of breathing: Secondary | ICD-10-CM | POA: Diagnosis not present

## 2016-12-19 DIAGNOSIS — J84112 Idiopathic pulmonary fibrosis: Secondary | ICD-10-CM | POA: Diagnosis not present

## 2016-12-31 ENCOUNTER — Encounter: Payer: Self-pay | Admitting: Internal Medicine

## 2016-12-31 ENCOUNTER — Ambulatory Visit (INDEPENDENT_AMBULATORY_CARE_PROVIDER_SITE_OTHER): Payer: Medicare Other | Admitting: Internal Medicine

## 2016-12-31 VITALS — BP 118/74 | HR 83 | Ht 64.0 in | Wt 144.8 lb

## 2016-12-31 DIAGNOSIS — R000 Tachycardia, unspecified: Secondary | ICD-10-CM

## 2016-12-31 DIAGNOSIS — J84112 Idiopathic pulmonary fibrosis: Secondary | ICD-10-CM

## 2016-12-31 DIAGNOSIS — Z23 Encounter for immunization: Secondary | ICD-10-CM

## 2016-12-31 NOTE — Progress Notes (Signed)
Subjective:     Patient ID: Diane Howell, female   DOB: 03-10-1940, 76 y.o.   MRN: [id number]  HPI     76 year old female former smoker seen for pulmonary consult 05/06/2015 for possible COPD plus or minus ILD with Dr. Chase Caller  Test Autoimmune and hypersensitivity pneumonitis panel 04/17/2015 is negative  Pulmonary function test between 03/01/2014 and 05/06/2015 is unchanged with an FVC of 1.87 L/68%. Total lung capacity of 3.45/68% and DLCO of 8.4/34%.   Chest x-ray 03/01/2014 that is reported as no active process but elevated right hemidiaphragm. I do not have his x-ray -Echocardiogram 03/08/2014 reported as normal ejection fraction with mild mitral regurgitation -Nuclear stress test 03/12/2014-reported as negative for ischemia Dr. Amanda Cockayne - PFT 03/01/2014: reported as moderately severe restrictive lung disease with severe perfusion defect . I personally visualized this PFT trace and shows FVC 1.8 L/64%, FEV1 1.4 L/67%, ratio of 78. No broncho-dilator response. Total lung capacity of 3.6 L/70% and DLCO of 8.1/33%.  Walking desaturation test 185 feet 3 laps: On room air desaturated to 84% on the second lap.   10/30/2015 Follow up : IPF  Patient returns for a 4 week follow-up. Pt is followed for probable COPD + ILD. Patient was sent for a surgical evaluation with Dr. Roxan Hockey and underwent a left video-assisted thorascopic lung biopsy on 05/30/2015. Biopsy showed active chronic interstitial pneumonitis with interstitial fibrosis and honeycomb change. There was usual interstitial pneumonia pattern in the biopsy the lesion is consistent with idiopathic pulmonary fibrosis. She was started on Esbriet, this was approved and she began this in May .  Since then she has had daily headache , no energy and low appetite with nausea. She lost 10 lbs.  Says she stopped this a month ago and feels much better. She refuses to ever take this again.  We discussed OFEV , she declines at this time.   Spirometry with dlco today shows FEV1 at 65%, ratio 81, FVC 59%, DLCO 41%, DLCO is actually improved since March 2017 when it was at 34% Walk TEST in the office shows no desaturations. O2 90% on RA on walking. This is improved.   She says she gets winded with walking long distance but remains active Going to beach this week.  Does have dry cough  Most days . Feels mucinex helps with mucus well.  She denies any chest pain, orthopnea, PND, or increased leg swelling.   OV 01/30/2016  Chief Complaint  Patient presents with  . Follow-up    40mo rov. pt pt states breathing is up & down. pt c/o sob with exertion & prod cough with clear mucus     Follow-up idiopathic pulmonary fibrosis biopsy-proven March 2017.  She did not tolerate Pirfenidone (Esbriet) in July August 2017. She stopped taking it. This because of GI side effects. She then refused to go on Ofev because of fear of side effects. At this point in time she is only on supportive therapy. She uses oxygen at night. Overall she says she is stable from a respiratory standpoint but in the last 1 month she has a new complaint of hot flashes. She insists is not associated with menopause. She's had over 20-30 episodes in the last 1 month. Last episode was 2-3 days ago. They're random. The episodic. No clear cut aggravating or relieving factors. Associated with significant diaphoresis. No associated fever or weight loss or hemoptysis. She is frustrated by symptoms.  Walking desaturation test on an 85 feet 3 laps on room  NID:[id number] at 1st ;lp at 83% and HR rose from 95 to 112/min  Spirometry profile is as below is shows progressive decline in forced vital capacity which is pretty much without therapy following a natural course of the disease.   OV 04/01/2016  Chief Complaint  Patient presents with  . Follow-up    Pt here after PFT. Pt states she is not ready to wear O2 when walking. Pt states her breathing is doing well. Pt denies cough  and CP/tightness.     S: Follow-up idiopathic pulmonary fibrosis biopsy-proven March 2017. She has intolerance to Pirfenidone (Esbriet).  Last visit 01/30/2016 we decided to start Ofev. She has a supply with her but she is yet to start because of fear of GI side effects. She says she will plan to see her primary care physician and then, to starting in 04/22/2016. Overall she feels stable. She, [place] supposed to [place]. She does have baseline stable exertional dyspnea however Pulmicort function test shows continued decline of percent in 2 years and she surprised by it. She is now participating in the pulmonary fibrosis foundation patient support group  OV 06/29/2016  Chief Complaint  Patient presents with  . Follow-up    Pt states she is no longer taking Ofev, GI symptoms. Pt states her breathing hasnt changed since last OV. Pt c/o dry cough. Pt denies CP/tightness and f/c/s.     Follow-up idiopathic pulmonary fibrosis biopsy-proven March 2017. Intolerant to Pirfenidone [facility]).  Last visit we started Ofev but she is intolerant to this to. Currently she is just on supportive care. Overall she feels stable. She does have some amount of shortness of breath with exertion. Walking desaturation test 185 feet 3 laps on room at: She desaturated second lap. It was noted that she was tachycardic at rest and this got worse 125 bpm with exertion. She is a participant in the support group. She has exertional oxygen. She is willing to participate in research trials. She is also open to the idea of trying a low-dose beta blocker even though she has low normal blood pressures in order to control the heart rate and see if this would help dyspnea.   OV 12/31/2016  Chief Complaint  Patient presents with  . Follow-up    Pt has had some side effects from her cymbalta so she took herself off of the medicine and symptoms have become better. Pt has learned what she can and cannot do. Pt does have  mild coughing spells usually at night. Denies any SOB or CP.     Follow-up idiopathic foamy fibrosis biopsy proven disease March 2017. Intolerant to both Esbriet and  Ofev  She is on basic supportive care. She tells me that since January 2019 she has not had any decline in lung function. I did add her on bisoprolol for her sinus tachycardia with exertion. She tells me that this is actually helping her, more distance walking. Nevertheless she wants to stop this medication because she does not like's polypharmacy. In the interim in summer 2018 she has stopped the Cymbalta because of side effects. She only takes Valium as needed right now for anxiety. She was initially interested in research protocols but at this point in time she says [place] beach [place] and is not interested in research visits. She will have a high dose flu shot today. She is content doing supportive care for IPF     Results for Ehrenfeld, [place]  L (MRN [id number]) as of 01/30/2016 14:00  Ref. Range 03/01/2014 16:03 05/06/2015 10:06 10/30/2015 11:19 04/01/2016   FVC-Pre Latest Units: L 1.81 1.87 1.63 1.59  FVC-%Pred-Pre Latest Units: % 64 68 59 58%   Results for CARREEN, MILIUS (MRN [id number]) as of 01/30/2016 14:00  Ref. Range 03/01/2014 16:03 05/06/2015 10:06 10/30/2015 11:19 04/01/2016   DLCO unc Latest Units: ml/min/mmHg 8.10 8.41 10.02 9.63  DLCO unc % pred Latest Units: % 33 34 41 75%        has a past medical history of Anxiety; Chronic back pain; COPD (chronic obstructive pulmonary disease) ([place]); Depression; Diaphragm, eventration (right); Diverticulosis; Fracture of sacrum ([place]); GERD (gastroesophageal reflux disease); Hematuria; History of hiatal hernia; Hyperlipidemia; OA (osteoarthritis); and Pneumonia.   reports that she quit smoking about 30 years ago. Her smoking use included Cigarettes. She has a 33.00 pack-year smoking history. She has never used smokeless tobacco.  Past Surgical  History:  Procedure Laterality Date  . BREAST REDUCTION SURGERY Bilateral   . DILATION AND CURETTAGE OF UTERUS    . JOINT REPLACEMENT Right 2010   hip  . LUNG BIOPSY Left 05/30/2015   Procedure: LEFT LUNG BIOPSY;  Surgeon: Melrose Nakayama, MD;  Location: [facility];  Service: Thoracic;  Laterality: Left;  . ROTATOR CUFF REPAIR Right   . TONSILLECTOMY    . TOTAL ABDOMINAL HYSTERECTOMY    . VASCULAR SURGERY    . VIDEO ASSISTED THORACOSCOPY Left 05/30/2015   Procedure: LEFT VIDEO ASSISTED THORACOSCOPY WITH BIOPSY;  Surgeon: Melrose Nakayama, MD;  Location: [facility];  Service: Thoracic;  Laterality: Left;    Allergies  Allergen Reactions  . Dust Mite Extract Other (See Comments)    Per allergy testing   . Mold Extract [Trichophyton] Other (See Comments)    Per allergy testing, mildew also  . Ofev [Nintedanib]     GI symptoms  . Other Other (See Comments)    Cigarette smoke per allergy testing, Perfumes per allergy testing  . Pirfenidone   . Pollen Extract Other (See Comments)    Per allergy testing    Immunization History  Administered Date(s) Administered  . Influenza, High Dose Seasonal PF 12/14/2015  . Pneumococcal-Unspecified 03/03/2015    Family History  Problem Relation Age of Onset  . Heart attack Father   . Heart failure Mother   . COPD Father   . Diabetes Mellitus II Father   . Breast cancer Mother   . Breast cancer Sister      Current Outpatient Prescriptions:  .  acetaminophen (TYLENOL) 325 MG tablet, Take 650 mg by mouth every 6 (six) hours as needed., Disp: , Rfl:  .  albuterol (PROVENTIL HFA;VENTOLIN HFA) 108 (90 Base) MCG/ACT inhaler, Inhale 2 puffs into the lungs every 6 (six) hours as needed for wheezing or shortness of breath., Disp: 8.5 Inhaler, Rfl: 2 .  ASCOMP-CODEINE 50-325-40-30 MG capsule, TAKE 1 CAPSULE BY MOUTH EVERY DAY AS NEEDED, Disp: , Rfl: 1 .  bisoprolol (ZEBETA) 5 MG tablet, TAKE 1/2 TABLET BY MOUTH EVERY DAY, Disp: 60 tablet, Rfl: 0 .   dexlansoprazole (DEXILANT) 60 MG capsule, Take 60 mg by mouth daily., Disp: , Rfl:  .  dextromethorphan-guaiFENesin (MUCINEX DM) 30-600 MG 12hr tablet, Take 1 tablet by mouth 2 (two) times daily., Disp: , Rfl:  .  diazepam (VALIUM) 5 MG tablet, Take 5 mg by mouth every 6 (six) hours as needed for anxiety., Disp: , Rfl:  .  fexofenadine (ALLEGRA) 180 MG tablet,  Take 180 mg by mouth daily., Disp: , Rfl:  .  HYDROcodone-acetaminophen (NORCO/VICODIN) 5-325 MG tablet, Take 1 tablet by mouth every 6 (six) hours as needed for moderate pain., Disp: , Rfl:  .  Misc Natural Products (OSTEO BI-FLEX ADV DOUBLE ST PO), Take by mouth daily., Disp: , Rfl:  .  Propylene Glycol (SYSTANE BALANCE) 0.6 % SOLN, Place 1 drop into both eyes daily as needed (for dry eyes)., Disp: , Rfl:  .  simvastatin (ZOCOR) 40 MG tablet, Take 40 mg by mouth daily., Disp: , Rfl:  .  umeclidinium-vilanterol (ANORO ELLIPTA) 62.5-25 MCG/INH AEPB, Inhale 1 puff into the lungs daily., Disp: , Rfl:    Review of Systems     Objective:   Physical Exam  Constitutional: She is oriented to person, place, and time. She appears well-developed and well-nourished. No distress.  HENT:  Head: Normocephalic and atraumatic.  Right Ear: External ear normal.  Left Ear: External ear normal.  Mouth/Throat: Oropharynx is clear and moist. No oropharyngeal exudate.  Eyes: Pupils are equal, round, and reactive to light. Conjunctivae and EOM are normal. Right eye exhibits no discharge. Left eye exhibits no discharge. No scleral icterus.  Neck: Normal range of motion. Neck supple. No JVD present. No tracheal deviation present. No thyromegaly present.  Cardiovascular: Normal rate, regular rhythm, normal heart sounds and intact distal pulses.  Exam reveals no gallop and no friction rub.   No murmur heard. Pulmonary/Chest: Effort normal. No respiratory distress. She has no wheezes. She has rales. She exhibits no tenderness.  Abdominal: Soft. Bowel sounds are  normal. She exhibits no distension and no mass. There is no tenderness. There is no rebound and no guarding.  Musculoskeletal: Normal range of motion. She exhibits no edema or tenderness.  Lymphadenopathy:    She has no cervical adenopathy.  Neurological: She is alert and oriented to person, place, and time. She has normal reflexes. No cranial nerve deficit. She exhibits normal muscle tone. Coordination normal.  Skin: Skin is warm and dry. No rash noted. She is not diaphoretic. No erythema. No pallor.  Psychiatric: She has a normal mood and affect. Her behavior is normal. Judgment and thought content normal.  Vitals reviewed.  Vitals:   12/31/16 1156  BP: 118/74  Pulse: 83  SpO2: 96%  Weight: 144 lb 12.8 oz (65.7 kg)  Height: 5\' 4"  (1.626 m)    Body mass index is 24.85 kg/m.     Assessment:       ICD-10-CM   1. IPF (idiopathic pulmonary fibrosis) ([place]) J84.112   2. Sinus tachycardia R00.0        Plan:      Sinus tachycardia This is due to your IPF and stress lung is putting on heart Looks like it might be helping you walk more but respect your desire against many meds Stop bisoprolol  IPF (idiopathic pulmonary fibrosis) ([place]) Stable clinically since last visit but slowly declining over time Too bad you are intolerant to both ofev and esbriet Respect refusal for [place] study  Plan - high dose flu shot 12/31/2016  - supportive care - In 6 months do Pre-bd spiro and dlco only. No lung volume or bd response.   Followup  - 6 months after spirometry     (> 50% of this 15 min visit spent in face to face counseling or/and coordination of care)  Dr. Brand Males, M.D., [facility].C.P Pulmonary and Critical Care Medicine Staff [facility]  Pager: [phone], If no answer or between  15:00h - 7:00h: call [phone]  12/31/2016 12:21 PM

## 2016-12-31 NOTE — Patient Instructions (Signed)
   Sinus tachycardia This is due to your IPF and stress lung is putting on heart Looks like it might be helping you walk more but respect your desire against many meds Stop bisoprolol  IPF (idiopathic pulmonary fibrosis) ([place]) Stable clinically since last visit but slowly declining over time Too bad you are intolerant to both ofev and esbriet Respect refusal for [place] study  Plan - high dose flu shot 12/31/2016  - supportive care - In 6 months do Pre-bd spiro and dlco only. No lung volume or bd response.   Followup  - 6 months after spirometry

## 2017-01-05 IMAGING — CR DG CHEST 2V
2 series · 2 of 2 positions shown · non-contrast
Comparison: 03/28/2015

CLINICAL DATA: Interstitial lung disease.  Preop lung biopsy.

EXAM:
CHEST  2 VIEW

[w chest pa]
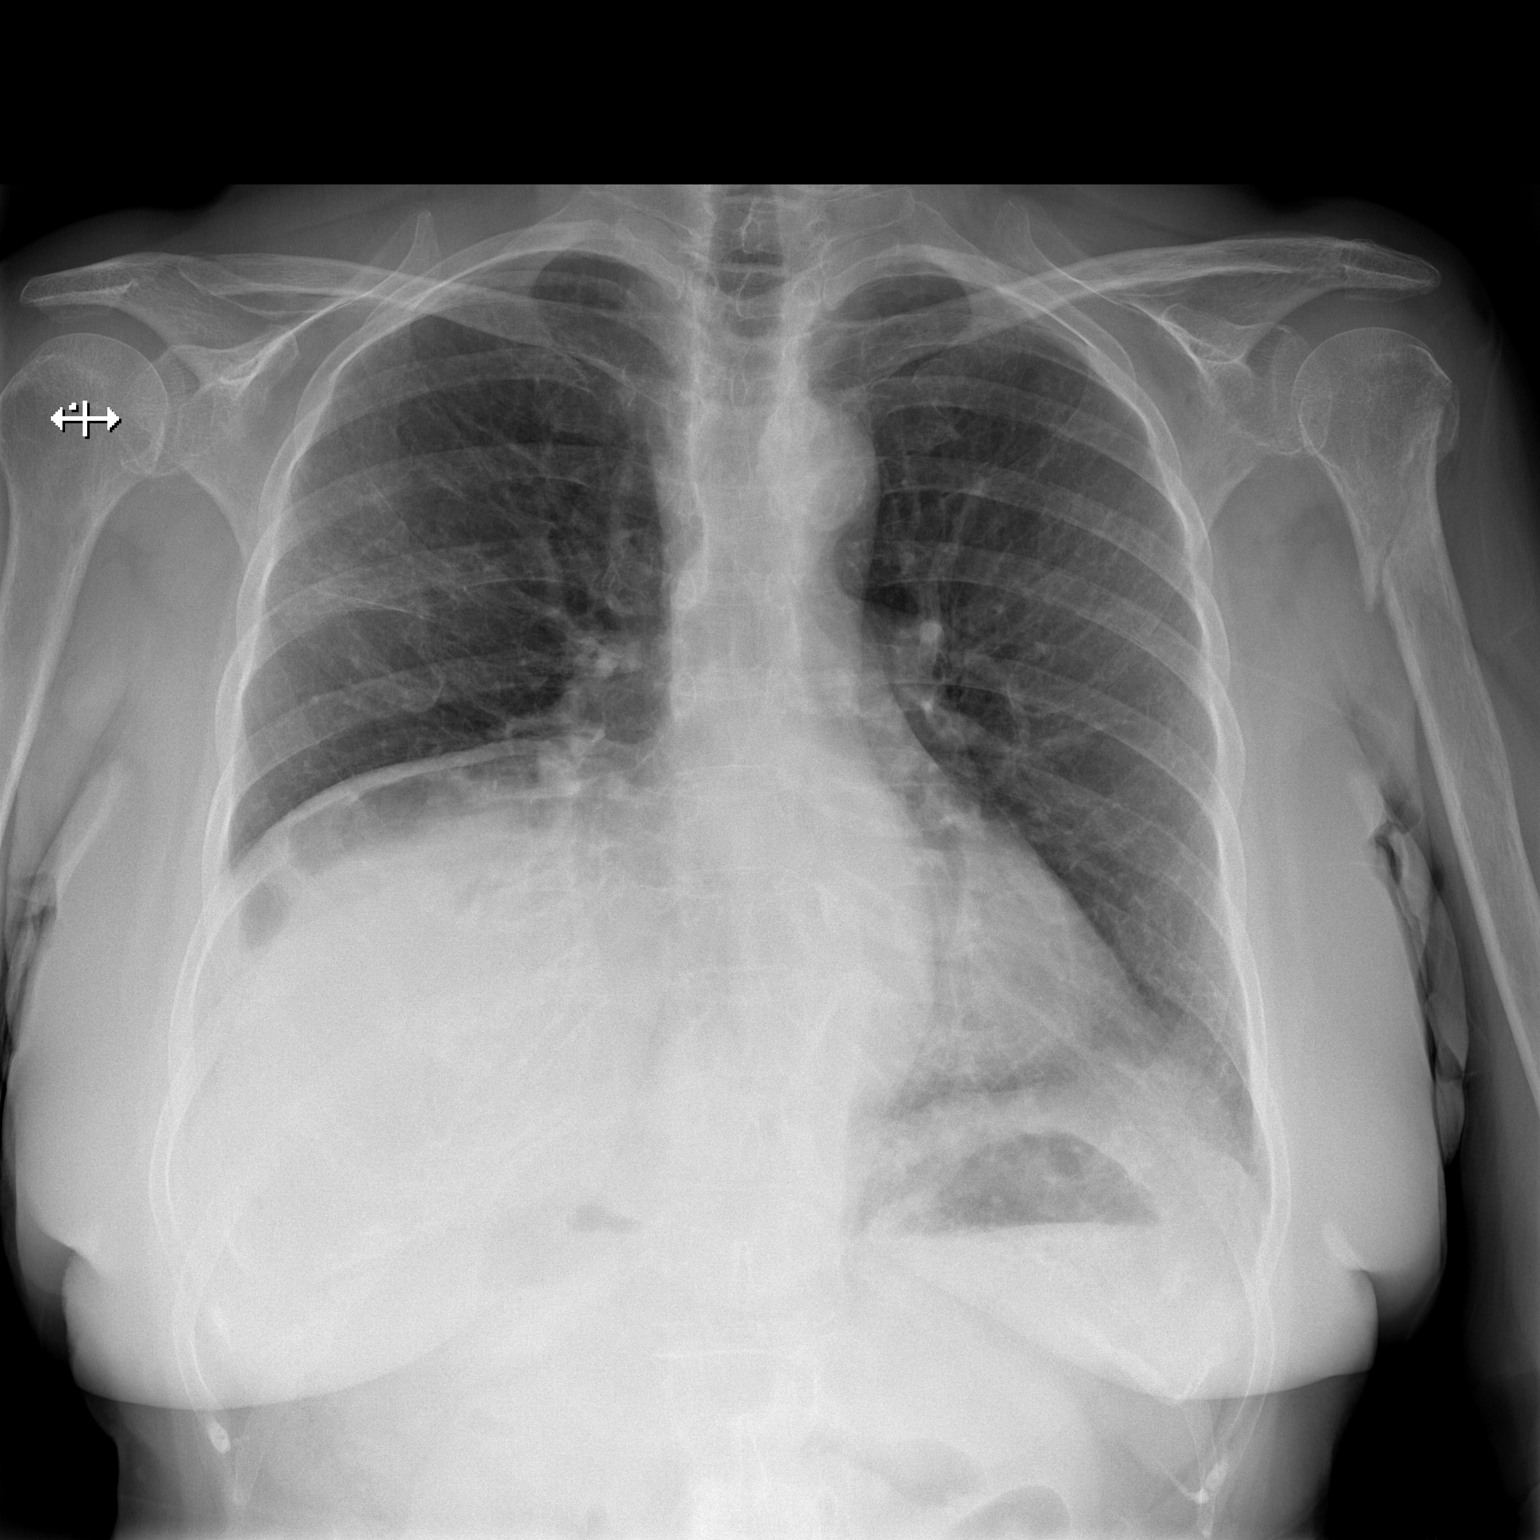

[w chest lat]
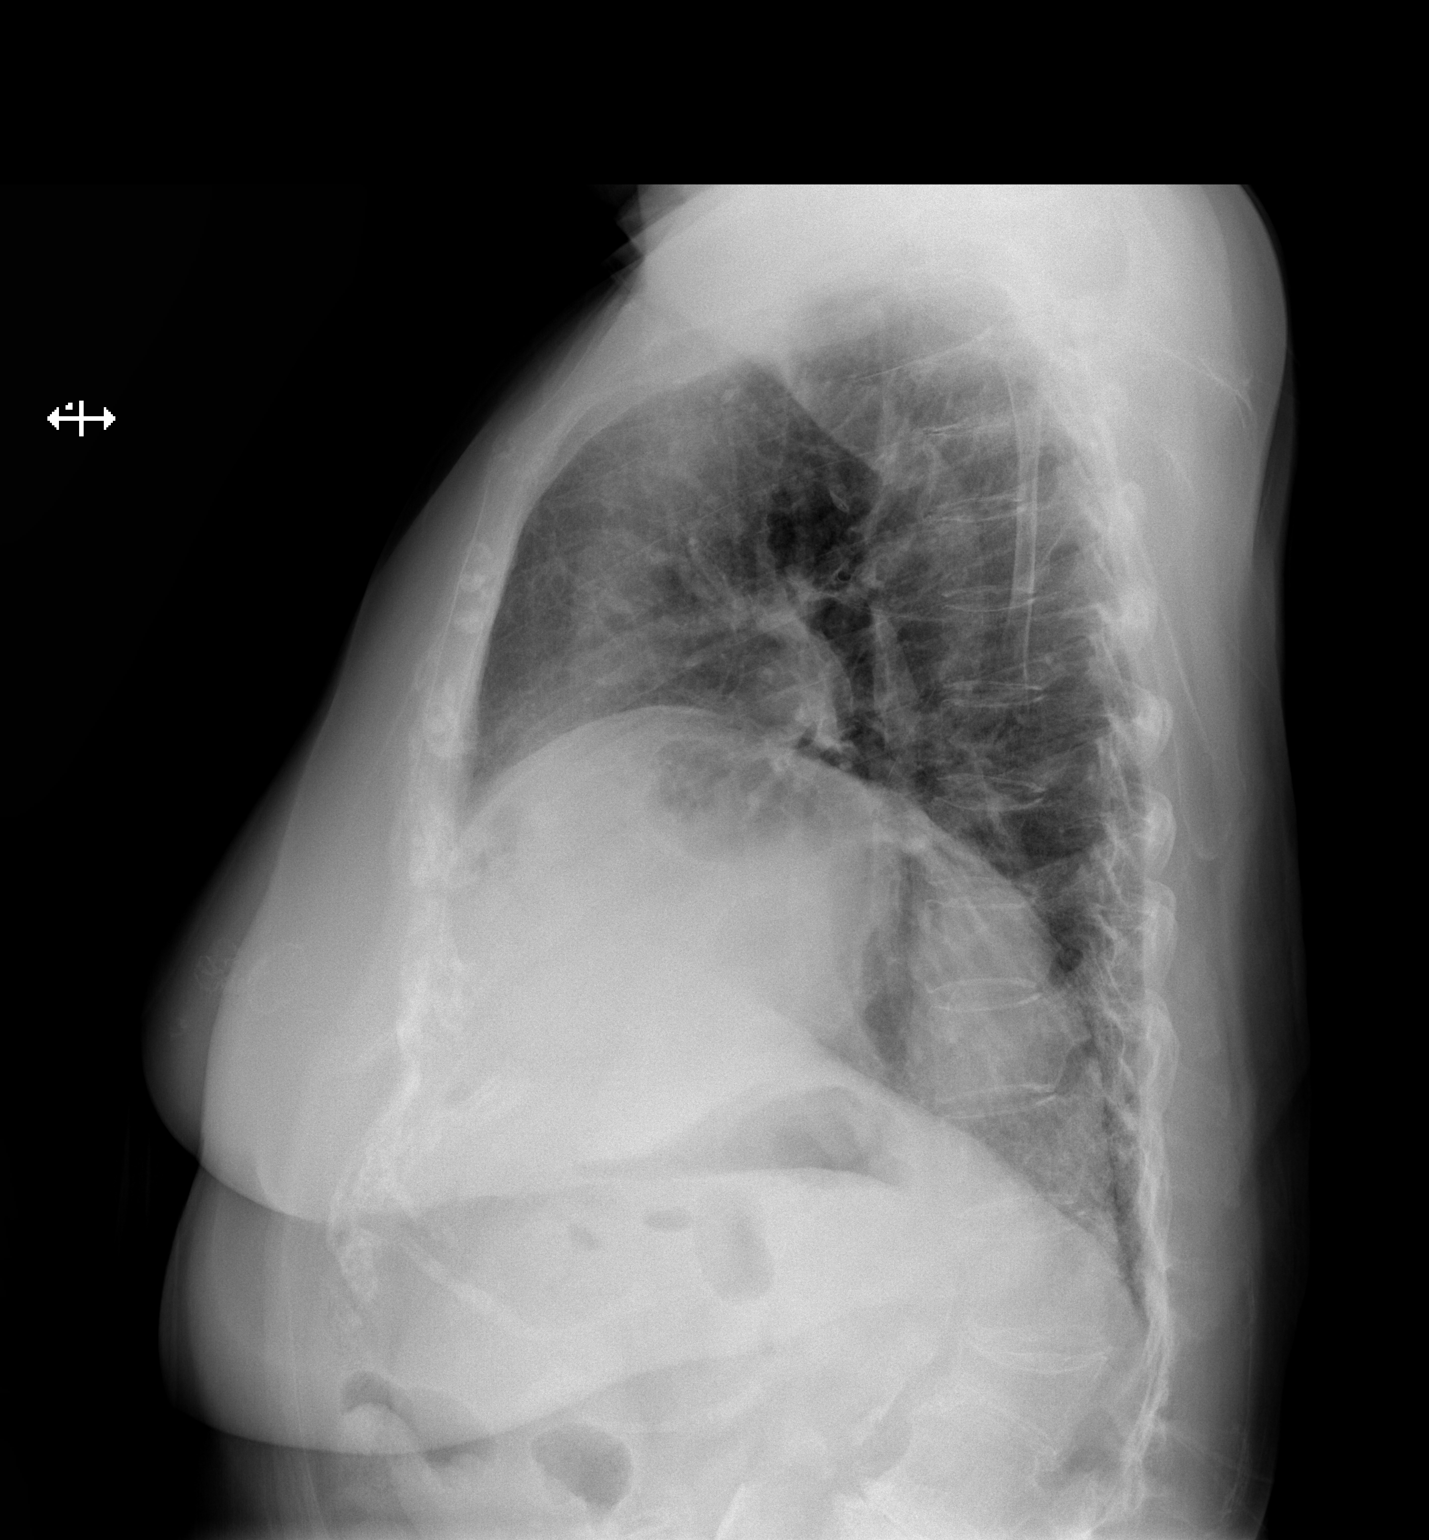

[2 of 2 positions shown; findings below may reference images not displayed]

FINDINGS: Elevation of the right hemidiaphragm, stable. No confluent airspace
opacities or effusions. Heart is normal size. No acute bony
abnormality. Stable moderate compression fracture in the upper
lumbar spine.
IMPRESSION: No acute findings.

## 2017-01-07 IMAGING — CR DG CHEST 1V PORT
1 series · 1 of 1 positions shown · non-contrast
Comparison: May 28, 2015

CLINICAL DATA: Status post left-sided video assisted thoracostomy.

EXAM:
PORTABLE CHEST 1 VIEW

[AP]
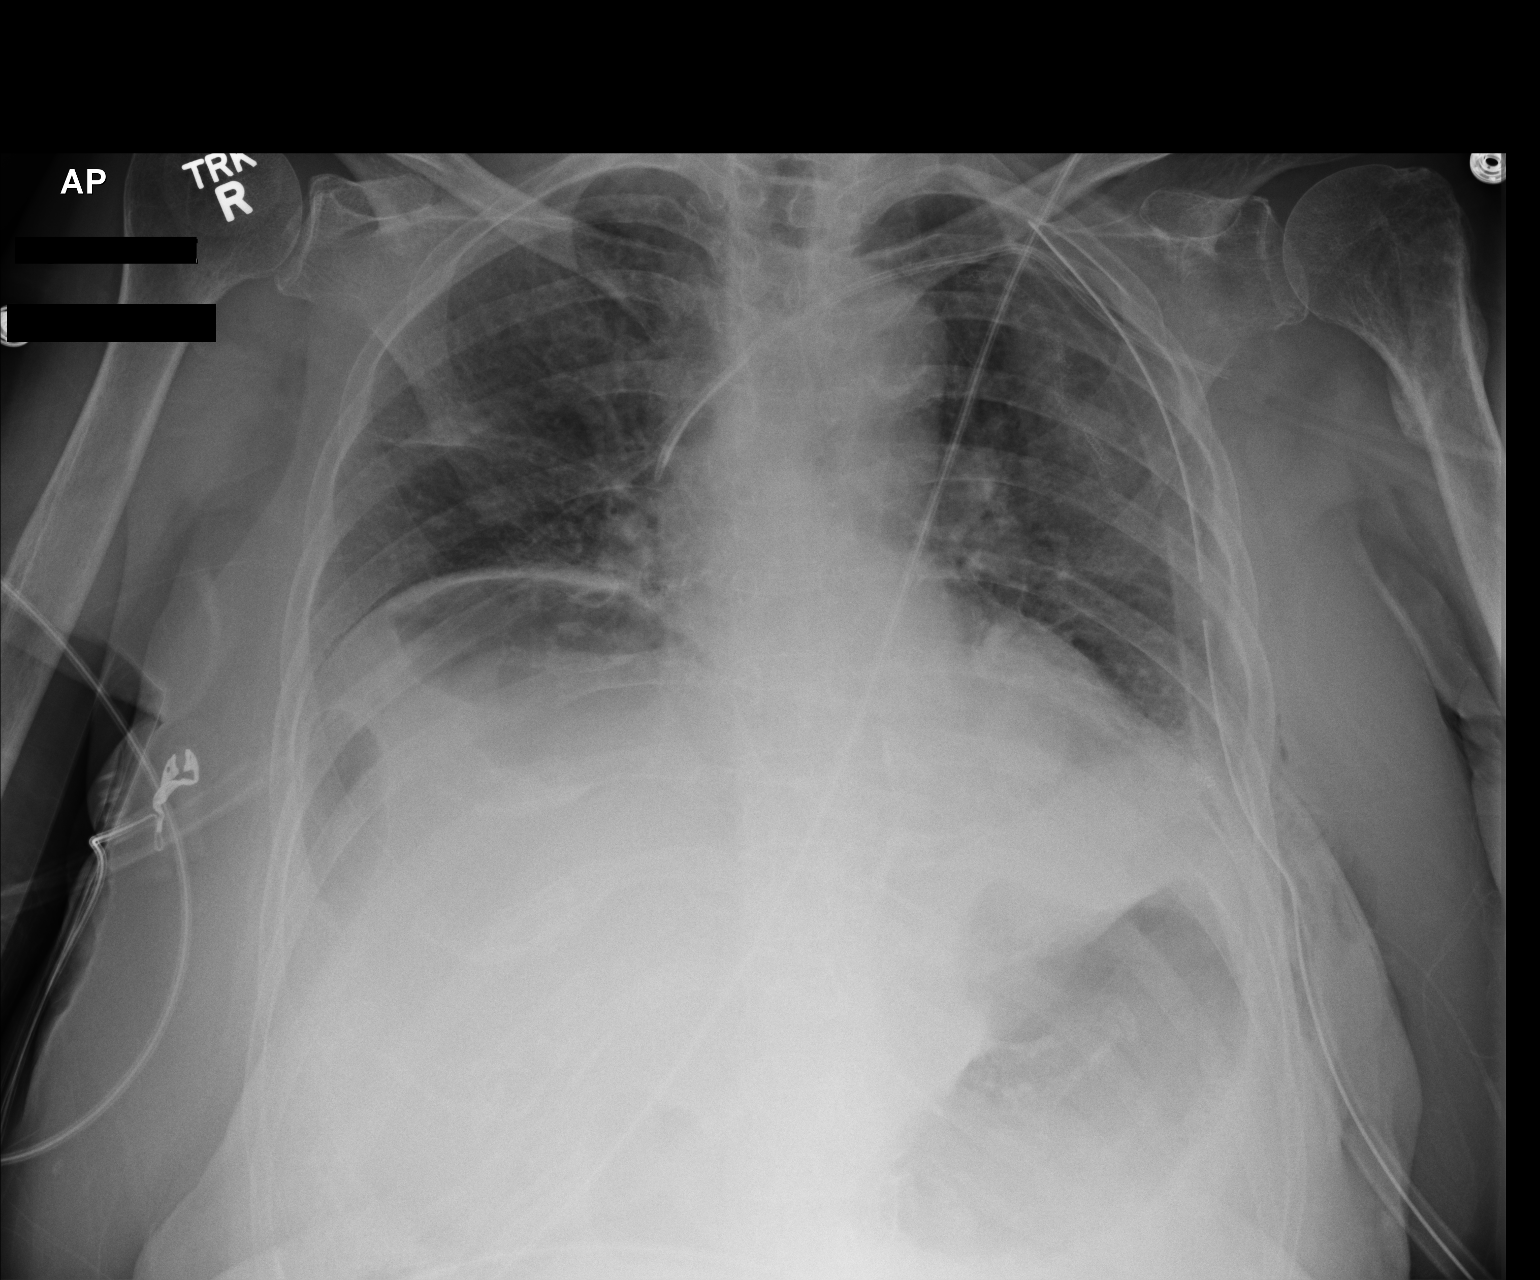

[1 of 1 positions shown; findings below may reference images not displayed]

FINDINGS: There is a chest tube on the left with postoperative change noted on
the left. Central catheter tip is in the superior vena cava. No
pneumothorax. There is persistent elevation of the right
hemidiaphragm. There is no appreciable edema or consolidation. Heart
size and pulmonary vascularity are normal. No apparent adenopathy.
Evidence of prior fracture of the proximal left humerus with
remodeling and healing.
IMPRESSION: Postoperative change on the left. Tube and catheter positions as
described. No pneumothorax. A small amount of subcutaneous air is
noted on the left inferiorly and laterally.

No edema or consolidation. Stable elevation right hemidiaphragm.
Cardiac silhouette within normal limits. Note that there is
interposition of colon between the diaphragm and liver on the right.

## 2017-01-19 DIAGNOSIS — R0689 Other abnormalities of breathing: Secondary | ICD-10-CM | POA: Diagnosis not present

## 2017-01-19 DIAGNOSIS — J84112 Idiopathic pulmonary fibrosis: Secondary | ICD-10-CM | POA: Diagnosis not present

## 2017-01-26 IMAGING — CR DG CHEST 2V
2 series · 2 of 2 positions shown · non-contrast
Comparison: Portable chest x-ray June 02, 2015 and March 28, 2015.

CLINICAL DATA: Status post video-assisted thoracic surgery for left
lung biopsy secondary to interstitial disease on May 30, 2015;
patient reports increase shortness of breath and left lower anterior
chest pain. History of COPD.

EXAM:
CHEST  2 VIEW

[view not recorded (1 of 2)]
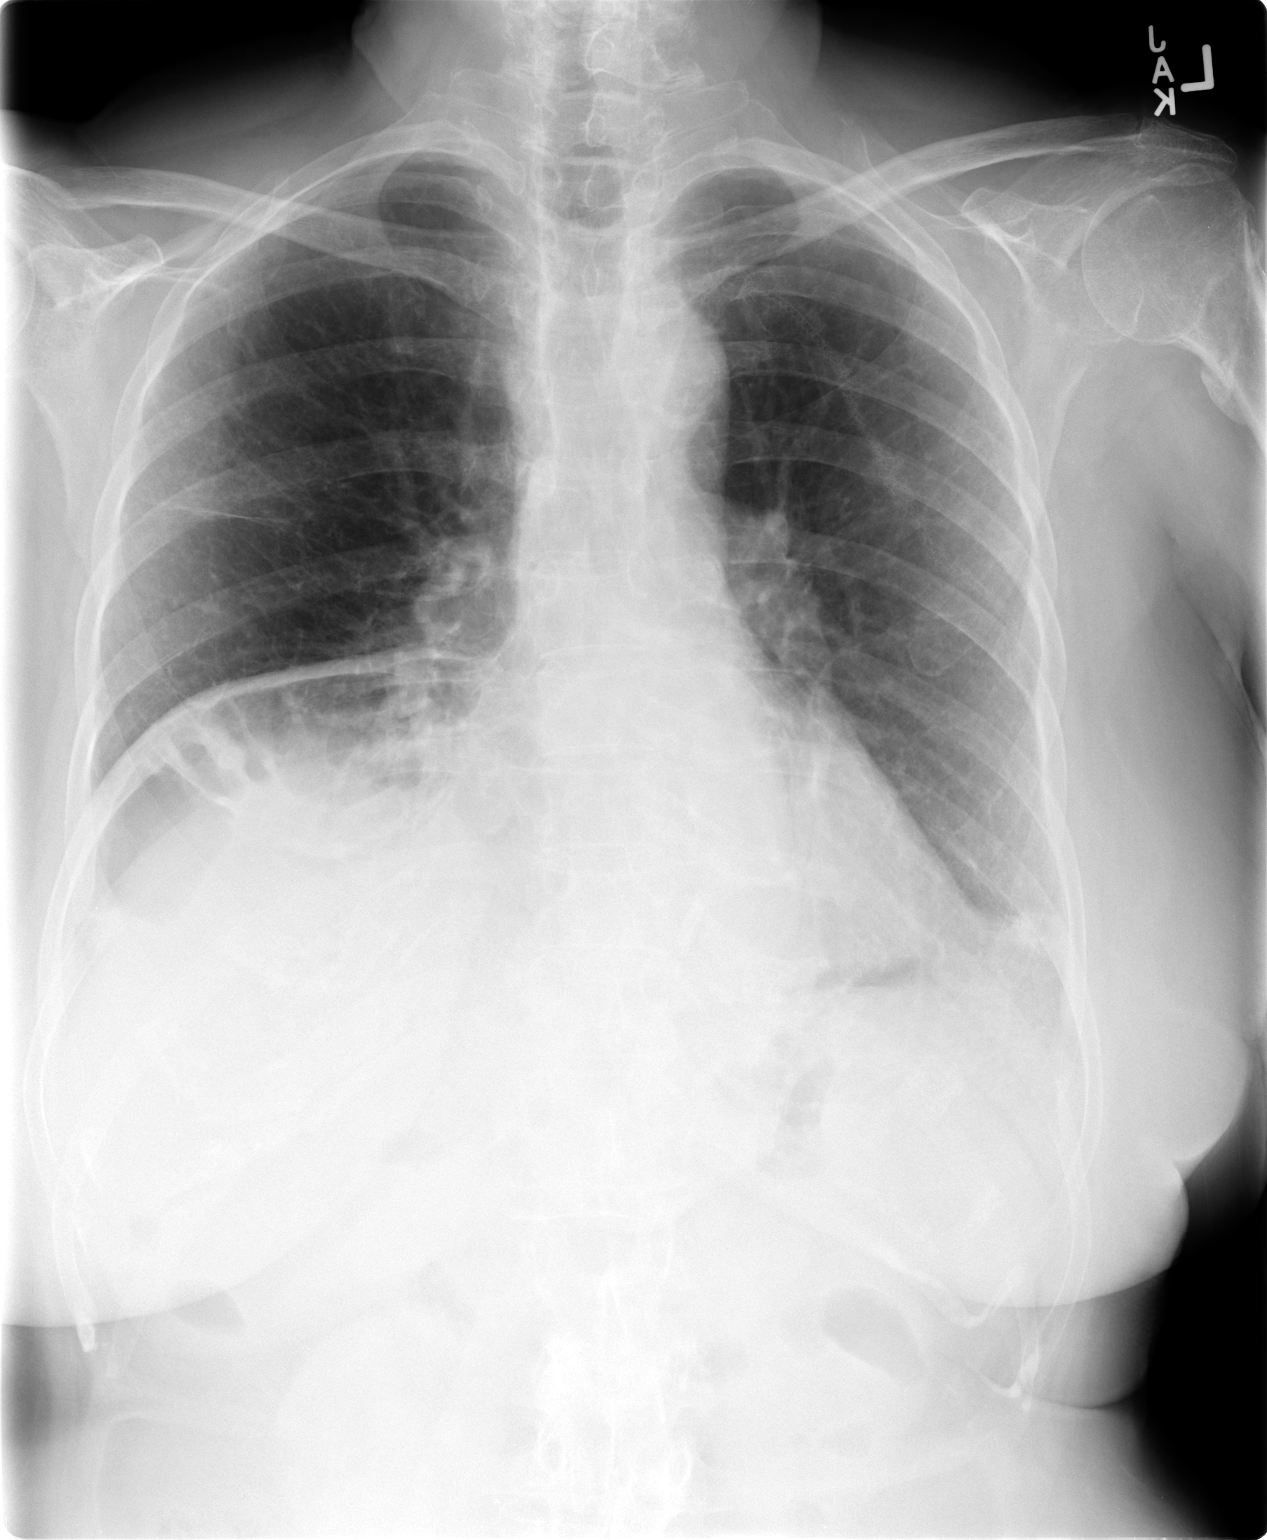

[view not recorded (2 of 2)]
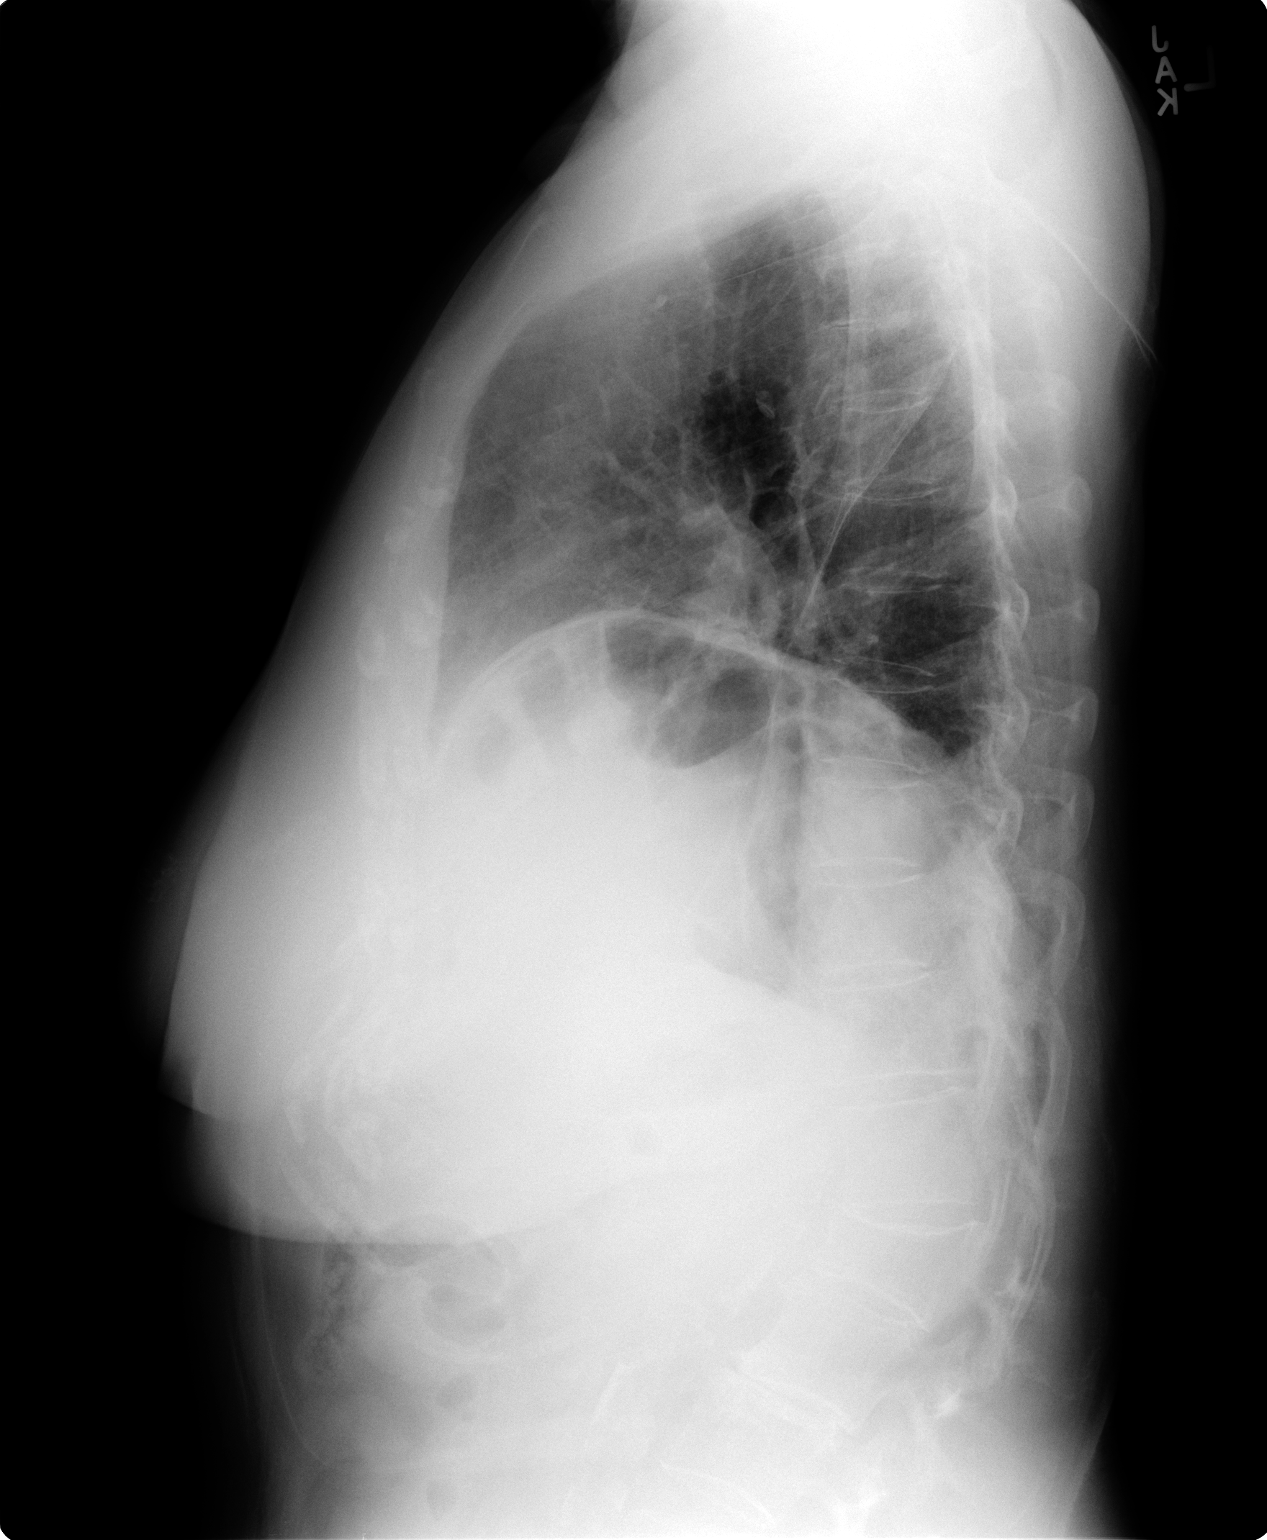

[2 of 2 positions shown; findings below may reference images not displayed]

FINDINGS: There is chronic elevation of the right hemidiaphragm. On the left
there is stable density just above the lateral costophrenic gutter.
There is no alveolar infiltrate or pneumothorax or significant
pleural effusion. There is a surgical suture line that is obliquely
oriented over the left upper lung. On the right there is mild
thickening of the minor fissure which is stable. The heart and
pulmonary vascularity are normal. The bony thorax is unremarkable.
IMPRESSION: Improved aeration of both lungs since the previous study. Chronic
right hemidiaphragm elevation. Postsurgical changes in the left
upper lobe. Stable scarring or less likely infiltrate laterally at
the left lung base.

## 2017-01-29 ENCOUNTER — Other Ambulatory Visit: Payer: Self-pay | Admitting: Internal Medicine

## 2017-01-29 DIAGNOSIS — J849 Interstitial pulmonary disease, unspecified: Secondary | ICD-10-CM

## 2017-02-12 ENCOUNTER — Other Ambulatory Visit: Payer: Self-pay | Admitting: *Deleted

## 2017-02-12 DIAGNOSIS — J849 Interstitial pulmonary disease, unspecified: Secondary | ICD-10-CM

## 2017-02-12 MED ORDER — BISOPROLOL FUMARATE 5 MG PO TABS: 2.5000 mg | ORAL_TABLET | Freq: Every day | ORAL | 5 refills | Status: DC

## 2017-02-18 DIAGNOSIS — R0689 Other abnormalities of breathing: Secondary | ICD-10-CM | POA: Diagnosis not present

## 2017-02-18 DIAGNOSIS — J84112 Idiopathic pulmonary fibrosis: Secondary | ICD-10-CM | POA: Diagnosis not present

## 2017-03-21 DIAGNOSIS — R0689 Other abnormalities of breathing: Secondary | ICD-10-CM | POA: Diagnosis not present

## 2017-03-21 DIAGNOSIS — J84112 Idiopathic pulmonary fibrosis: Secondary | ICD-10-CM | POA: Diagnosis not present

## 2017-03-26 DIAGNOSIS — G8929 Other chronic pain: Secondary | ICD-10-CM | POA: Diagnosis not present

## 2017-03-26 DIAGNOSIS — M5442 Lumbago with sciatica, left side: Secondary | ICD-10-CM | POA: Diagnosis not present

## 2017-03-26 DIAGNOSIS — M5441 Lumbago with sciatica, right side: Secondary | ICD-10-CM | POA: Diagnosis not present

## 2017-04-15 DIAGNOSIS — E7800 Pure hypercholesterolemia, unspecified: Secondary | ICD-10-CM | POA: Diagnosis not present

## 2017-04-15 DIAGNOSIS — Z79899 Other long term (current) drug therapy: Secondary | ICD-10-CM | POA: Diagnosis not present

## 2017-04-15 DIAGNOSIS — Z5181 Encounter for therapeutic drug level monitoring: Secondary | ICD-10-CM | POA: Diagnosis not present

## 2017-04-15 DIAGNOSIS — E559 Vitamin D deficiency, unspecified: Secondary | ICD-10-CM | POA: Diagnosis not present

## 2017-04-15 DIAGNOSIS — Z0000 Encounter for general adult medical examination without abnormal findings: Secondary | ICD-10-CM | POA: Diagnosis not present

## 2017-04-15 DIAGNOSIS — R739 Hyperglycemia, unspecified: Secondary | ICD-10-CM | POA: Diagnosis not present

## 2017-04-21 DIAGNOSIS — R0689 Other abnormalities of breathing: Secondary | ICD-10-CM | POA: Diagnosis not present

## 2017-04-21 DIAGNOSIS — J84112 Idiopathic pulmonary fibrosis: Secondary | ICD-10-CM | POA: Diagnosis not present

## 2017-04-22 DIAGNOSIS — E7800 Pure hypercholesterolemia, unspecified: Secondary | ICD-10-CM | POA: Diagnosis not present

## 2017-04-22 DIAGNOSIS — Z79899 Other long term (current) drug therapy: Secondary | ICD-10-CM | POA: Diagnosis not present

## 2017-04-22 DIAGNOSIS — Z0000 Encounter for general adult medical examination without abnormal findings: Secondary | ICD-10-CM | POA: Diagnosis not present

## 2017-04-22 DIAGNOSIS — E039 Hypothyroidism, unspecified: Secondary | ICD-10-CM | POA: Diagnosis not present

## 2017-05-19 DIAGNOSIS — J84112 Idiopathic pulmonary fibrosis: Secondary | ICD-10-CM | POA: Diagnosis not present

## 2017-05-19 DIAGNOSIS — R0689 Other abnormalities of breathing: Secondary | ICD-10-CM | POA: Diagnosis not present

## 2017-05-26 ENCOUNTER — Other Ambulatory Visit: Payer: Self-pay | Admitting: Internal Medicine

## 2017-06-19 DIAGNOSIS — J84112 Idiopathic pulmonary fibrosis: Secondary | ICD-10-CM | POA: Diagnosis not present

## 2017-06-19 DIAGNOSIS — R0689 Other abnormalities of breathing: Secondary | ICD-10-CM | POA: Diagnosis not present

## 2017-07-19 DIAGNOSIS — R0689 Other abnormalities of breathing: Secondary | ICD-10-CM | POA: Diagnosis not present

## 2017-07-19 DIAGNOSIS — J84112 Idiopathic pulmonary fibrosis: Secondary | ICD-10-CM | POA: Diagnosis not present

## 2017-08-09 DIAGNOSIS — E039 Hypothyroidism, unspecified: Secondary | ICD-10-CM | POA: Diagnosis not present

## 2017-08-09 DIAGNOSIS — Z79899 Other long term (current) drug therapy: Secondary | ICD-10-CM | POA: Diagnosis not present

## 2017-08-09 DIAGNOSIS — Z5181 Encounter for therapeutic drug level monitoring: Secondary | ICD-10-CM | POA: Diagnosis not present

## 2017-08-09 DIAGNOSIS — E7800 Pure hypercholesterolemia, unspecified: Secondary | ICD-10-CM | POA: Diagnosis not present

## 2017-08-11 DIAGNOSIS — Z5181 Encounter for therapeutic drug level monitoring: Secondary | ICD-10-CM | POA: Diagnosis not present

## 2017-08-12 DIAGNOSIS — Z79899 Other long term (current) drug therapy: Secondary | ICD-10-CM | POA: Diagnosis not present

## 2017-08-12 DIAGNOSIS — Z780 Asymptomatic menopausal state: Secondary | ICD-10-CM | POA: Diagnosis not present

## 2017-08-12 DIAGNOSIS — E039 Hypothyroidism, unspecified: Secondary | ICD-10-CM | POA: Diagnosis not present

## 2017-08-12 DIAGNOSIS — R05 Cough: Secondary | ICD-10-CM | POA: Diagnosis not present

## 2017-08-12 DIAGNOSIS — E7800 Pure hypercholesterolemia, unspecified: Secondary | ICD-10-CM | POA: Diagnosis not present

## 2017-08-13 ENCOUNTER — Other Ambulatory Visit: Payer: Self-pay | Admitting: Internal Medicine

## 2017-08-13 DIAGNOSIS — J849 Interstitial pulmonary disease, unspecified: Secondary | ICD-10-CM

## 2017-08-19 ENCOUNTER — Telehealth: Payer: Self-pay | Admitting: Internal Medicine

## 2017-08-19 DIAGNOSIS — J84112 Idiopathic pulmonary fibrosis: Secondary | ICD-10-CM | POA: Diagnosis not present

## 2017-08-19 DIAGNOSIS — R0689 Other abnormalities of breathing: Secondary | ICD-10-CM | POA: Diagnosis not present

## 2017-08-19 NOTE — Telephone Encounter (Signed)
Please bring her in first available ILD clinic 30 min slot after Pre-bd spiro and dlco only. No lung volume or bd response. No post-bd spiro . Last seen nov 2018. She is not on any Rx I think

## 2017-08-19 NOTE — Telephone Encounter (Signed)
10/05/17 is fine: ILD clinic only

## 2017-08-19 NOTE — Telephone Encounter (Signed)
First available appt with MR and for pt to have PFT at the same visit is not until 8/6 with pt having 60min PFT as well.  MR, please advise if this is okay with the appt being this far out knowing you did say first available, or if pt needs to be seen sooner and if can be seen by an APP.

## 2017-08-20 ENCOUNTER — Ambulatory Visit
Admission: RE | Admit: 2017-08-20 | Discharge: 2017-08-20 | Disposition: A | Discharge status: Home / Self Care | Payer: Medicare Other | Source: Ambulatory Visit | Attending: Internal Medicine | Admitting: Internal Medicine

## 2017-08-20 DIAGNOSIS — J849 Interstitial pulmonary disease, unspecified: Secondary | ICD-10-CM

## 2017-08-20 DIAGNOSIS — J439 Emphysema, unspecified: Secondary | ICD-10-CM | POA: Diagnosis not present

## 2017-08-20 NOTE — Telephone Encounter (Signed)
Looked at schedule on 8/6 and somehow the hold for ILD clinic pt's only was removed.  Have spoken to [facility] regarding this due to four pts on the schedule are not ILD pts and also a couple pts that are ILD pts were scheduled in a 15 min slot.  Sharl Ma has put the ILD clinic appts only back on there and is going to remove the four pts that should not be in the ILD clinc.  Once this has been taken care of, will call pt to get her on the schedule for an appt in ILD clinic.

## 2017-08-26 NOTE — Telephone Encounter (Signed)
Called and spoke with pt letting her know MR wanted me to get her scheduled for a f/u visit having PFT before.  Pt expressed understanding. Pt's appt was scheduled Thurs, 8/8 with PFT at 3:00 and pt seeing MR at 3:30  Nothing further needed.

## 2017-08-27 ENCOUNTER — Telehealth: Payer: Self-pay | Admitting: Internal Medicine

## 2017-08-27 NOTE — Telephone Encounter (Signed)
[place] to discuss phase 3 [employer] study given fact there is no GI side effect with this MAb and she did not tolerate esbriet and was not interested in ofev. Explained this study - she said she wants to think about it. Will ask [facility] staff to mail her ICF next week and when I See her ofice visit 8/819 and can discuss more  Also gave her results of her recent CT chest done by PCP Jani Gravel, MD - no new findings  Dr. Brand Males, M.D., [facility].C.P Pulmonary and Critical Care Medicine Staff Physician, [place] Director - Interstitial Lung Disease  Program  Pulmonary [place] at [place], [place], [address]  Pager: [phone], If no answer or between  15:00h - 7:00h: call [phone] Telephone: [phone]

## 2017-09-02 DIAGNOSIS — M5442 Lumbago with sciatica, left side: Secondary | ICD-10-CM | POA: Diagnosis not present

## 2017-09-02 DIAGNOSIS — G8929 Other chronic pain: Secondary | ICD-10-CM | POA: Diagnosis not present

## 2017-09-02 DIAGNOSIS — M5441 Lumbago with sciatica, right side: Secondary | ICD-10-CM | POA: Diagnosis not present

## 2017-09-18 DIAGNOSIS — R0689 Other abnormalities of breathing: Secondary | ICD-10-CM | POA: Diagnosis not present

## 2017-09-18 DIAGNOSIS — J84112 Idiopathic pulmonary fibrosis: Secondary | ICD-10-CM | POA: Diagnosis not present

## 2017-10-06 DIAGNOSIS — M5440 Lumbago with sciatica, unspecified side: Secondary | ICD-10-CM | POA: Diagnosis not present

## 2017-10-07 ENCOUNTER — Ambulatory Visit (INDEPENDENT_AMBULATORY_CARE_PROVIDER_SITE_OTHER): Payer: Medicare Other | Admitting: Internal Medicine

## 2017-10-07 ENCOUNTER — Ambulatory Visit: Payer: Medicare Other | Admitting: Internal Medicine

## 2017-10-07 ENCOUNTER — Encounter: Payer: Self-pay | Admitting: Internal Medicine

## 2017-10-07 VITALS — BP 130/78 | HR 78 | Ht 64.0 in | Wt 141.0 lb

## 2017-10-07 DIAGNOSIS — J84112 Idiopathic pulmonary fibrosis: Secondary | ICD-10-CM

## 2017-10-07 LAB — PULMONARY FUNCTION TEST
DL/VA % PRED: 82 %
DL/VA: 3.98 ml/min/mmHg/L
DLCO unc % pred: 45 %
DLCO unc: 11.07 ml/min/mmHg
FEF 25-75 Pre: 1.76 L/sec
FEF2575-%Pred-Pre: 121 %
FEV1-%Pred-Pre: 78 %
FEV1-PRE: 1.55 L
FEV1FVC-%PRED-PRE: 114 %
FEV6-%Pred-Pre: 72 %
FEV6-PRE: 1.82 L
FEV6FVC-%Pred-Pre: 105 %
FVC-%PRED-PRE: 69 %
FVC-PRE: 1.82 L
Pre FEV1/FVC ratio: 85 %
Pre FEV6/FVC Ratio: 100 %

## 2017-10-07 NOTE — Patient Instructions (Signed)
ICD-10-CM   1. IPF (idiopathic pulmonary fibrosis) ([facility]) J84.112     Stable disase Moderate severity based on exertional drop in oxygen and breathing test   Plan Continue supportive care as discussed Respect lack of interest in research trials  Followup As needed given your move to [facility]  - glad you have pulmonary doc there.   - let us know and we can send our records there

## 2017-10-07 NOTE — Progress Notes (Signed)
PFT completed today.  

## 2017-10-07 NOTE — Progress Notes (Addendum)
Diane Howell    [id number]    1940/08/12  Primary Care Physician:Kim, Jeneen Rinks, MD  Referring Physician: Jani Gravel, MD [address], [place] [address]  Chief complaint:  F/u for IPF  HPI: Diane Howell is a 77 yo F former smoker w/ PMH of idiopathic pulmonary fibrosis confirmed on imaging and biopsy in 05/2015 presenting to the clinic for 6 month follow up. She states that she has been feeling great for the last 6 months. States her on-going dyspnea and cough has had significant decrease in severity. She is now able to ambulate at walking pace without dyspnea as long as she avoids humid air and she carries around a hand-held fan which helps her breathe. She also states that she cannot remember the last time she used her rescue inhaler and all of her cough and congestive symptoms are alleviated by Mucinex. She also mentions that she is in the process of moving to [place], [place] and she is getting ready to transfer her care and finding a new pulmonologist closer to her new home.  Allergies as of 10/07/2017 - Review Complete 10/07/2017  Allergen Reaction Noted  . Dust mite extract Other (See Comments) 05/23/2015  . Mold extract [trichophyton] Other (See Comments) 05/23/2015  . Ofev [nintedanib]    . Other Other (See Comments) 05/23/2015  . Pirfenidone  04/30/2016  . Pollen extract Other (See Comments) 05/23/2015    Past Medical History:  Diagnosis Date  . Anxiety   . Chronic back pain   . COPD (chronic obstructive pulmonary disease) ([facility])   . Depression   . Diaphragm, eventration right  . Diverticulosis   . Fracture of sacrum ([facility])   . GERD (gastroesophageal reflux disease)   . Hematuria   . History of hiatal hernia    hx  . Hyperlipidemia   . OA (osteoarthritis)   . Pneumonia     Past Surgical History:  Procedure Laterality Date  . BREAST REDUCTION SURGERY Bilateral   . DILATION AND CURETTAGE OF UTERUS    . JOINT REPLACEMENT Right  2010   hip  . LUNG BIOPSY Left 05/30/2015   Procedure: LEFT LUNG BIOPSY;  Surgeon: Melrose Nakayama, MD;  Location: [facility];  Service: Thoracic;  Laterality: Left;  . ROTATOR CUFF REPAIR Right   . TONSILLECTOMY    . TOTAL ABDOMINAL HYSTERECTOMY    . VASCULAR SURGERY    . VIDEO ASSISTED THORACOSCOPY Left 05/30/2015   Procedure: LEFT VIDEO ASSISTED THORACOSCOPY WITH BIOPSY;  Surgeon: Melrose Nakayama, MD;  Location: [facility];  Service: Thoracic;  Laterality: Left;     Review of systems: Review of Systems  Constitutional: Negative for fever and chills.  HENT: Negative.   Eyes: Negative for blurred vision.  Respiratory: as per HPI  Cardiovascular: Negative for chest pain and palpitations.  Gastrointestinal: Negative for vomiting, diarrhea, blood per rectum. Genitourinary: Negative for dysuria, urgency, frequency and hematuria.  Musculoskeletal: Negative for myalgias, back pain and joint pain.  Skin: Negative for itching and rash.  Neurological: Negative for dizziness, tremors, focal weakness, seizures and loss of consciousness.  Endo/Heme/Allergies: Negative for environmental allergies.  Psychiatric/Behavioral: Negative for depression, suicidal ideas and hallucinations.  All other systems reviewed and are negative.  Physical Exam: Blood pressure 130/78, pulse 78, height 5\' 4"  (1.626 m), weight 141 lb (64 kg), SpO2 100 %. Gen:      No acute distress HEENT:  EOMI, sclera anicteric Neck:  No masses; no thyromegaly Lungs:    Normal respiratory effort, Lower lobe crackles bilaterally CV:         Regular rate and rhythm; no murmurs Abd:      + bowel sounds; soft, non-tender; no palpable masses, no distension Ext:    No edema; adequate peripheral perfusion Skin:      Warm and dry; no rash Neuro: alert and oriented x 3 Psych: normal mood and affect  Data Reviewed: Walk Test: Simple office walk 185 feet x  3 laps goal with forehead probe   O2 used Room air  Number laps completed  3  Comments about pace Normal Pace  Resting Pulse Ox/HR 100/78  Final Pulse Ox/HR 88/105  Desaturated </= 88% 2  Desaturated <= 3% points 3  Got Tachycardic >/= 90/min 3  Symptoms at end of test Normal  Miscellaneous comments State she ' feels fine'     Results for DANAHI, REDDISH (MRN [id number]) as of 01/30/2016 14:00  Ref. Range 03/01/2014 16:03 05/06/2015 10:06 10/30/2015 11:19 04/01/2016 10/07/2017   FVC-Pre Latest Units: L 1.81 1.87 1.63 1.59 1.82  FVC-%Pred-Pre Latest Units: % 64 68 59 58% 69%   Results for JANNINE, ABREU (MRN [id number]) as of 01/30/2016 14:00  Ref. Range 03/01/2014 16:03 05/06/2015 10:06 10/30/2015 11:19 04/01/2016 10/07/2017   DLCO unc Latest Units: ml/min/mmHg 8.10 8.41 10.02 9.63 11.07  DLCO unc % pred Latest Units: % 33 34 41 75% 45%   Assessment:  Diane Howell 77 yo w/ confirmed IPF presents with improvements in her symptoms. Her PFT show stable status of her IPF but her desaturation events during ambulation is concerning. She is currently only on supportive care  Plan/Recommendations: - Offer for experimental Fibrinogen infusion trials were declined - Facilitate transfer of care to pulmonologist in [facility], [place] [facility] 10/07/2017, 3:45 PM  CC: Jani Gravel, MD    STAFF NOTE: I, Dr Ann Lions have personally reviewed patient's available data, including medical history, events of note, physical examination and test results as part of my evaluation. I have discussed with resident/NP and other care providers such as pharmacist, RN and RRT.  In addition,  I personally evaluated patient and elicited key findings of   S:  - follow-up idiopathic pulmonary fibrosis indeterminate CT scan with biopsy proven UIP. She is on supportive care after having been intolerant to both Pirfenidone (Esbriet) and Ofev. She is on supportive care. She reports that in the last 1 year she feels better without any shortness of breath on  exertion. However when we walked her she desaturated quickly and 185 feet 1 lap on room air but she denied any symptoms. She even feels that her crackles have resolved but the resident tells me that the crackles are still present on exam. A few weeks ago we sent her research consent copy foran IV infusion trial but she's not interested. She thinks she is normal to [place] permanently in the next few months  O: looks well Bilateral bibasal Velcro crackles  June 2019 high-resolution CT scan of the chest has a very indeterminate pattern for UIP on the CT scan of the chest Personally visualized  Primary function test shows restriction with low diffusion unchanged over time  A: IPF - suspect moderate severity given exertional desaturation but asymptomatic. Stable over time. Intolerant to established anti-fibrotic therapy both ofev and Pirfenidone (Esbriet). offered research trial without any GI side effects but she declined  P: continue  supportive care Offered oxygen with exertion but she refused Follow-up as needed because she is moving to [facility] has a pulmonologist identified thre)  > 50% of this > 25 min visit spent in face to face counseling or coordination of care - by this undersigned MD - Dr Brand Males. This includes one or more of the following documented above: discussion of test results, diagnostic or treatment recommendations, prognosis, risks and benefits of management options, instructions, education, compliance or risk-factor reduction     .  Rest per /medical resident whose note is outlined above and that I agree with  Dr. Brand Males, M.D., [facility].C.P Pulmonary and Critical Care Medicine Staff Physician [facility] Pager: [phone], If no answer or between  15:00h - 7:00h: call [phone]  10/07/2017 4:26 PM

## 2017-10-19 DIAGNOSIS — J84112 Idiopathic pulmonary fibrosis: Secondary | ICD-10-CM | POA: Diagnosis not present

## 2017-10-19 DIAGNOSIS — R0689 Other abnormalities of breathing: Secondary | ICD-10-CM | POA: Diagnosis not present

## 2017-10-28 DIAGNOSIS — H43811 Vitreous degeneration, right eye: Secondary | ICD-10-CM | POA: Diagnosis not present

## 2017-10-28 DIAGNOSIS — H43393 Other vitreous opacities, bilateral: Secondary | ICD-10-CM | POA: Diagnosis not present

## 2017-10-28 DIAGNOSIS — H15843 Scleral ectasia, bilateral: Secondary | ICD-10-CM | POA: Diagnosis not present

## 2017-10-28 DIAGNOSIS — H40013 Open angle with borderline findings, low risk, bilateral: Secondary | ICD-10-CM | POA: Diagnosis not present

## 2017-10-28 DIAGNOSIS — H179 Unspecified corneal scar and opacity: Secondary | ICD-10-CM | POA: Diagnosis not present

## 2017-11-19 DIAGNOSIS — R0689 Other abnormalities of breathing: Secondary | ICD-10-CM | POA: Diagnosis not present

## 2017-11-19 DIAGNOSIS — J84112 Idiopathic pulmonary fibrosis: Secondary | ICD-10-CM | POA: Diagnosis not present

## 2017-11-22 DIAGNOSIS — D179 Benign lipomatous neoplasm, unspecified: Secondary | ICD-10-CM | POA: Diagnosis not present

## 2017-11-22 DIAGNOSIS — T8484XA Pain due to internal orthopedic prosthetic devices, implants and grafts, initial encounter: Secondary | ICD-10-CM | POA: Diagnosis not present

## 2017-12-07 DIAGNOSIS — E7800 Pure hypercholesterolemia, unspecified: Secondary | ICD-10-CM | POA: Diagnosis not present

## 2017-12-07 DIAGNOSIS — E039 Hypothyroidism, unspecified: Secondary | ICD-10-CM | POA: Diagnosis not present

## 2017-12-07 DIAGNOSIS — Z79899 Other long term (current) drug therapy: Secondary | ICD-10-CM | POA: Diagnosis not present

## 2017-12-13 DIAGNOSIS — R1903 Right lower quadrant abdominal swelling, mass and lump: Secondary | ICD-10-CM | POA: Diagnosis not present

## 2017-12-13 DIAGNOSIS — E7800 Pure hypercholesterolemia, unspecified: Secondary | ICD-10-CM | POA: Diagnosis not present

## 2017-12-13 DIAGNOSIS — I10 Essential (primary) hypertension: Secondary | ICD-10-CM | POA: Diagnosis not present

## 2017-12-13 DIAGNOSIS — K219 Gastro-esophageal reflux disease without esophagitis: Secondary | ICD-10-CM | POA: Diagnosis not present

## 2017-12-19 DIAGNOSIS — J84112 Idiopathic pulmonary fibrosis: Secondary | ICD-10-CM | POA: Diagnosis not present

## 2017-12-19 DIAGNOSIS — R0689 Other abnormalities of breathing: Secondary | ICD-10-CM | POA: Diagnosis not present

## 2018-06-17 ENCOUNTER — Other Ambulatory Visit: Payer: Self-pay | Admitting: Internal Medicine
